# Patient Record
Sex: Female | Born: 1970 | Race: White | Hispanic: No | Marital: Married | Smoking: Never smoker
Health system: Southern US, Community
[De-identification: ages and names within clinical notes are randomized; demographics above are authoritative.]

## PROBLEM LIST (undated history)

## (undated) DIAGNOSIS — L511 Stevens-Johnson syndrome: Secondary | ICD-10-CM

## (undated) DIAGNOSIS — Z9889 Other specified postprocedural states: Secondary | ICD-10-CM

## (undated) DIAGNOSIS — F419 Anxiety disorder, unspecified: Secondary | ICD-10-CM

## (undated) DIAGNOSIS — R635 Abnormal weight gain: Secondary | ICD-10-CM

## (undated) DIAGNOSIS — E669 Obesity, unspecified: Secondary | ICD-10-CM

## (undated) DIAGNOSIS — E039 Hypothyroidism, unspecified: Secondary | ICD-10-CM

## (undated) DIAGNOSIS — Z975 Presence of (intrauterine) contraceptive device: Secondary | ICD-10-CM

## (undated) DIAGNOSIS — R319 Hematuria, unspecified: Secondary | ICD-10-CM

## (undated) HISTORY — DX: Abnormal weight gain: R63.5

## (undated) HISTORY — DX: Obesity, unspecified: E66.9

## (undated) HISTORY — DX: Presence of (intrauterine) contraceptive device: Z97.5

## (undated) HISTORY — PX: LAPAROSCOPIC GASTRIC SLEEVE RESECTION: SHX5895

## (undated) HISTORY — DX: Stevens-Johnson syndrome: L51.1

## (undated) HISTORY — PX: WRIST SURGERY: SHX841

## (undated) HISTORY — DX: Hematuria, unspecified: R31.9

## (undated) HISTORY — DX: Anxiety disorder, unspecified: F41.9

## (undated) HISTORY — PX: CHOLECYSTECTOMY: SHX55

## (undated) HISTORY — PX: KNEE SURGERY: SHX244

---

## 1998-01-15 ENCOUNTER — Emergency Department (HOSPITAL_COMMUNITY): Admission: EM | Admit: 1998-01-15 | Discharge: 1998-01-15 | Payer: Self-pay | Admitting: *Deleted

## 2002-02-07 ENCOUNTER — Ambulatory Visit (HOSPITAL_COMMUNITY): Admission: AD | Admit: 2002-02-07 | Discharge: 2002-02-07 | Payer: Self-pay | Admitting: Obstetrics and Gynecology

## 2002-03-08 ENCOUNTER — Ambulatory Visit (HOSPITAL_COMMUNITY): Admission: AD | Admit: 2002-03-08 | Discharge: 2002-03-08 | Payer: Self-pay | Admitting: Obstetrics and Gynecology

## 2002-03-10 ENCOUNTER — Ambulatory Visit (HOSPITAL_COMMUNITY): Admission: RE | Admit: 2002-03-10 | Discharge: 2002-03-10 | Payer: Self-pay | Admitting: Obstetrics and Gynecology

## 2002-03-12 ENCOUNTER — Ambulatory Visit (HOSPITAL_COMMUNITY): Admission: AD | Admit: 2002-03-12 | Discharge: 2002-03-12 | Payer: Self-pay | Admitting: Obstetrics & Gynecology

## 2002-03-27 ENCOUNTER — Inpatient Hospital Stay (HOSPITAL_COMMUNITY): Admission: AD | Admit: 2002-03-27 | Discharge: 2002-03-29 | Payer: Self-pay | Admitting: Obstetrics and Gynecology

## 2002-10-15 ENCOUNTER — Emergency Department (HOSPITAL_COMMUNITY): Admission: EM | Admit: 2002-10-15 | Discharge: 2002-10-15 | Payer: Self-pay | Admitting: *Deleted

## 2002-10-15 ENCOUNTER — Encounter: Payer: Self-pay | Admitting: *Deleted

## 2002-10-15 ENCOUNTER — Inpatient Hospital Stay (HOSPITAL_COMMUNITY): Admission: AD | Admit: 2002-10-15 | Discharge: 2002-10-17 | Payer: Self-pay | Admitting: General Surgery

## 2004-02-05 ENCOUNTER — Ambulatory Visit (HOSPITAL_COMMUNITY): Admission: RE | Admit: 2004-02-05 | Discharge: 2004-02-05 | Payer: Self-pay | Admitting: Family Medicine

## 2007-09-20 ENCOUNTER — Other Ambulatory Visit: Admission: RE | Admit: 2007-09-20 | Discharge: 2007-09-20 | Payer: Self-pay | Admitting: Obstetrics and Gynecology

## 2008-09-04 ENCOUNTER — Other Ambulatory Visit: Admission: RE | Admit: 2008-09-04 | Discharge: 2008-09-04 | Payer: Self-pay | Admitting: Obstetrics and Gynecology

## 2010-10-01 ENCOUNTER — Other Ambulatory Visit (HOSPITAL_COMMUNITY)
Admission: RE | Admit: 2010-10-01 | Discharge: 2010-10-01 | Disposition: A | Discharge status: Home / Self Care | Payer: 59 | Source: Ambulatory Visit | Attending: Obstetrics and Gynecology | Admitting: Obstetrics and Gynecology

## 2010-10-01 DIAGNOSIS — Z01419 Encounter for gynecological examination (general) (routine) without abnormal findings: Secondary | ICD-10-CM | POA: Insufficient documentation

## 2010-10-23 NOTE — Op Note (Signed)
   NAMEPATTI, SHORB                         ACCOUNT NO.:  [id number]   MEDICAL RECORD NO.:  [id number]                   PATIENT TYPE:  INP   LOCATION:  A428                                 FACILITY:  [facility]   PHYSICIAN:  Tilda Burrow, M.D.              DATE OF BIRTH:  30-Jan-1971   DATE OF PROCEDURE:  03/28/2002  DATE OF DISCHARGE:                                 OPERATIVE REPORT   TIME OF DELIVERY:  11:15 a.m.   PROCEDURE:  Spontaneous vertex vaginal delivery.   PROCEDURE IN DETAIL:  The patient progressed to anterior lip dilation and  pushed briefly for a second stage of less than 15 minutes, delivering over  an intact perineum with a second-degree perineal laceration delivering a  healthy 7 pound 13.6 ounce female infant, Apgars 9/9.  Delivery was attended  by the patient's husband.  The infant was suctioned.  Amniotic fluid was  clear without malodor and the infant was placed on the maternal abdomen.  The cord was clamped and cord blood samples obtained.  There was obtaining  of a sample for fetal blood cryopreservation.  This consisted of prepping a  portion of the cord near the introitus, placing a 14-gauge needle into the  umbilical vein, and collecting approximately 100 cc of cord blood.  We then  clamped the cord and then proceeded with obtaining the fetal blood gas from  the cord from the arterial sample of the blood in the cord and also a venous  sample for routine labs here at the hospital.  The patient tolerated the  procedure well with shelf presentation delivery of the placenta then repair  of the second-degree perineal laceration using continuous 2-0 Vicryl under  excellent condition under epidural anesthesia.  The patient then had the  epidural catheter removed with tip visualized and intact.  Estimated blood  loss of delivery 500 cc.                                               Tilda Burrow, M.D.    JVF/MEDQ  D:  03/28/2002  T:  03/28/2002  Job:   [id number]

## 2010-10-23 NOTE — Discharge Summary (Signed)
NAMEBETHANY, [name]                         ACCOUNT NO.:  [id number]   MEDICAL RECORD NO.:  [id number]                   PATIENT TYPE:  INP   LOCATION:  A303                                 FACILITY:  [facility]   PHYSICIAN:  [name], M.D.              DATE OF BIRTH:  05-22-1971   DATE OF ADMISSION:  10/15/2002  DATE OF DISCHARGE:  10/17/2002                                 DISCHARGE SUMMARY   DIAGNOSES:  Cholecystitis and cholelithiasis.   SECONDARY DIAGNOSIS:  Hypothyroidism.   PROCEDURE:  On 10/16/2002, laparoscopic cholecystectomy.   HISTORY:  This is a 40 year old, obese white female who has had several  episodes of right upper quadrant pain, nausea, and vomiting.  She was seen  in the emergency room, and she was noted to have on sonography  cholelithiasis.  She had some minimal thickening of her gallbladder with  normal common bile duct size and liver function studies which were within  normal limits.  The patient, however, had considerable right upper quadrant  pain and required hospitalization in my opinion.  She was admitted on  10/15/2002.   HOSPITAL COURSE:  She was taken to surgery on 10/16/2002 after hydration and  antibiotics were initiated.  She underwent laparoscopic cholecystectomy  uneventfully on 10/16/2002.  Intraoperatively, she was noted to have acute  and chronic calculus cholecystitis.  There was some resolving edema in the  right upper quadrant, otherwise was within normal limits.  She did have  cholelithiasis.  The procedure was able to be performed laparoscopically  without problems.   Postoperatively, the patient did quite well.  She had no problems from her  surgery so that she was discharged on the following postoperative day.  At  the time of discharge, she was tolerating liquids well.  Her wounds were  clean.  She was having no dysuria, no nausea, no vomiting, and her pain was  controlled.   LABORATORY DATA:  Electrolytes were all within  normal limits.  TSH was  20.937.  Free T4 was 0.84.  T3 uptake was 33.1.  On 10/17/2002, hemoglobin  was 11.3, hematocrit 33.1.  As stated, her liver function studies and  amylase preoperatively were within normal limits.   Sonogram showed minimal thickening with normal hepatic radicles.  I do not  see her sonogram on the chart at the time of this dictation.   Her cervix imaging was negative.   DISPOSITION:  Her hospital course was uneventful.  She was discharged on the  first postoperative day, and we made plans for followup with her.  She was  discharged on full liquid and soft diet.  She was told to advance her diet  as tolerated.  Increase her activity likewise as tolerated.  She is  permitted to shower  She is told to do no heavy lifting or straining or  sexual activity in the immediate postoperative period.  She was told to  clean her wounds with alcohol three times a day, use no aspirin products.   DISCHARGE MEDICATIONS:  She was given prescription for Darvocet-N 100 one  tablet every 4 hours as needed for pain, and she is told to resume her  thyroid medications and follow up on her hypothyroid status with her medical  service.  I will make followup arrangements after which she is to return to  Dr. Lilyan Punt for any medical problems she may have in the future.                                               [name], M.D.    WB/MEDQ  D:  10/24/2002  T:  10/24/2002  Job:  [id number]   cc:   Lorin Picket A. Gerda Diss, M.D.  [address], [address]  [place]  [place] [address]  Fax: [phone]   Tilda Burrow, M.D.  [address] [place]  [place] [address]  Fax: [phone]

## 2010-10-23 NOTE — Op Note (Signed)
   NAMELEDONNA, DORMER                         ACCOUNT NO.:  [id number]   MEDICAL RECORD NO.:  [id number]                   PATIENT TYPE:  INP   LOCATION:  A428                                 FACILITY:  [facility]   PHYSICIAN:  Tilda Burrow, M.D.              DATE OF BIRTH:  10/29/1970   DATE OF PROCEDURE:  03/28/2002  DATE OF DISCHARGE:                                 OPERATIVE REPORT   PROCEDURE:  Epidural catheter placement.   DESCRIPTION OF PROCEDURE:  Epidural catheter placed using loss of resistance  technique at L3-4 or L2-3 interspace, with excellent success.  We found the  epidural space on the first attempt at approximately 7 cm depth from the  skin with 5 cc test dose of 1.5% Xylocaine with epinephrine instilled,  followed by insertion of the epidural catheter 2.5 cm into the epidural  space, with removal of Tuohy needle and taping of the catheter to the  patient's back without difficulty.  The patient tolerated the procedure well  and is having excellent analgesic level at T10.                                               Tilda Burrow, M.D.    JVF/MEDQ  D:  03/28/2002  T:  03/28/2002  Job:  [id number]

## 2010-10-23 NOTE — H&P (Signed)
[name], [name] NO.:  [id number]   MEDICAL RECORD NO.:  [id number]                   PATIENT TYPE:   LOCATION:                                       FACILITY:   PHYSICIAN:  Tilda Burrow, M.D.              DATE OF BIRTH:   DATE OF ADMISSION:  03/27/2002  DATE OF DISCHARGE:                                HISTORY & PHYSICAL   ADMITTING DIAGNOSIS:  1. Pregnancy [redacted] weeks gestation.  2. Elective induction of labor.   HISTORY OF PRESENT ILLNESS:  This is a 40 year old female, gravida 2, para  1, AB 0, LMP 06/28/01 placing initial EDC at 04/04/2002 with corresponding  first and second trimester ultrasounds.  She is admitted after a pregnancy  course followed through out office with appropriate weight gain with the  estimated fetal weight of 8.3 pounds on ultrasound 03/26/2002 with upper  limits normal amniotic fluid index of 21.6.  She has had progressive  headaches, nausea, vomiting, and nose bleeds over the past week. She has had  a blood pressure check at home of about 148/100.  The family members have  checked her blood pressures regularly.  She has 2+ reflexes without clonus.  She is admitted after discussion of options and requests for induction as  she is exhausted and strongly desires delivery.   Of note, is that the patient's previous pregnancy ended in induction at 39  weeks for mild polyhydramnios and she was admitted for Cytotec ripening, had  a wonderful response to Cytotec twice on microgram tablets placed per vagina  x2 with a 6 hour labor.  Cervix is essentially the same this time as with  the previous pregnancy being 1 cm long posterior vertex presentation well  applied, soft.  The patient is aware of the usual risks of cesarean delivery  or the need for emergency intervention occurring with induced deliveries.   PAST MEDICAL HISTORY:  Positive for hypothyroidism treated this pregnancy  with Synthroid with TSH within normal  limits when last checked at [redacted] weeks  gestation.   LABORATORY DATA:  Prenatal labs include blood type A positive, urine index  3, negative rubella immunity present.  Hemoglobin 12, hematocrit 40.2.  Hepatitis, HIV, GC,  Chlamydia, and RPR all normal.  MSAFP normal.  Pap  smear class 1.  Glucose tolerance test 121 mg%.   MEDICATIONS:  Synthroid 0.15 mg every day, prenatal vitamins, Zoloft 50 mg  every day, Prenate GT.   SOCIAL HISTORY:  Married, Glass blower/designer.    PHYSICAL EXAMINATION:  VITAL SIGNS:  Height 5 feet 4 inches, weight 270  which is an 8 pound weight gain.  ABDOMEN:  Fundal height 40-42 cm.  Estimated fetal weight 8.3 pounds.   PLAN:  Cytotec cervical ripening on the evening of 03/27/02.  Pitocin  induction on 03/28/02.  Tilda Burrow, M.D.    JVF/MEDQ  D:  03/26/2002  T:  03/27/2002  Job:  [id number]

## 2010-10-23 NOTE — Op Note (Signed)
[name], [name]                         ACCOUNT NO.:  [id number]   MEDICAL RECORD NO.:  [id number]                   PATIENT TYPE:  INP   LOCATION:  A303                                 FACILITY:  [facility]   PHYSICIAN:  [name], M.D.              DATE OF BIRTH:  04/21/1971   DATE OF PROCEDURE:  10/16/2002  DATE OF DISCHARGE:  10/17/2002                                 OPERATIVE REPORT   PREOPERATIVE DIAGNOSES:  Cholecystitis and cholelithiasis.   POSTOPERATIVE DIAGNOSES:  Cholecystitis and cholelithiasis.   PROCEDURE:  Laparoscopic cholecystectomy.   SPECIMENS:  Gallbladder with stones.   HISTORY OF PRESENT ILLNESS:  This is a 40 year old obese white female who  had several episodes of right upper quadrant pain, nausea and vomiting. She  was seen through the emergency room at [facility]. She was found to  have on sonogram, cholelithiasis with minimal thickening and no dilatation  of the common bile duct or intrahepatic biliary radicals. Her liver function  studies, bilirubin, and amylase all were within normal limits  preoperatively. We had planned to take her to surgery. We discussed the  surgery preoperatively with her, discussing complications, not limited to  but including bleeding, infection, damage to bile duct, perforation of  organs and transitory diarrhea. Informed consent was obtained.   OPERATIVE FINDINGS:  The patient had a small cystic duct which was not  cannulated. Multiple stones within the gallbladder and some adhesions and  resolving edema of the gallbladder. Otherwise, the right upper quadrant  appeared to be normal.   TECHNIQUE:  The patient was placed in the supine position with the adequate  administration of general anesthesia via endotracheal intubation. Her entire  abdomen was prepped with Betadine solution and draped in the usual manner.  Prior to this, a Foley catheter was aseptically inserted and then with the  patient in  Trendelenburg, a periumbilical incision was carried out on the  superior aspect of the umbilicus and with the patient in Trendelenburg, the  fascia was grasped with a sharp towel clip and a Veress needle was inserted  and confirmed in position with a saline drop test. The Visiport technique  was then used and an 11 mm [place] surgical cannula was placed in the umbilicus  and then under direct vision, three other cannulas were placed with an 11 mm  cannula in the epigastrium and two 5 mm cannulas in the right upper quadrant  laterally. The gallbladder was then grasped and it's adhesions were taken  down. The cystic duct was clearly visualized, triply silver clipped on the  side of the common bile duct and singularly silver clipped on the side of  the gallbladder. The cystic artery was likewise triply silver clipped and  divided and the gallbladder was then removed from the liver bed using the  hook cautery device. There was a minimal amount of oozing. This was easily  controlled  with a cautery device. I elected to leave a piece of Surgicel  within the liver bed. After irrigating and checking for hemostasis and  removing the gallbladder from the 11 mm cannulas in the epigastrium using  the Endo-sac device, the abdomen was then checked for hemostasis and was  deemed to be complete. The abdomen was then desufflated. The fascial  incisions around the 11 mm cannula site of  the epigastric and the umbilicus were closed with 0 poly-sorb suture and all  skin wounds were closed with the stapling device. Sterile op-site dressing  was applied. No drains were placed. Estimated blood loss was minimal. The  patient received 1500 to 1600 cc of Crystalloid intraoperatively. There were  no complications.                                               [name], M.D.    WB/MEDQ  D:  10/16/2002  T:  10/17/2002  Job:  [id number]   cc:   Tilda Burrow, M.D.  [address] [place]  [place]  [address]  Fax: [phone]   Scott A. Gerda Diss, M.D.  [address]  [place]  [place] [address]  Fax: [phone]

## 2010-10-23 NOTE — H&P (Signed)
NAME:  FERGIE, SHERBERT                         ACCOUNT NO.:  [id number]   MEDICAL RECORD NO.:  [id number]                  PATIENT TYPE:  PINP   LOCATION:  303                                  FACILITY:   PHYSICIAN:  [name], M.D.              DATE OF BIRTH:   DATE OF ADMISSION:  10/15/2002  DATE OF DISCHARGE:                                HISTORY & PHYSICAL   NOTE:  This is a 40 year old obese white female who had recurrent episodes  of right upper quadrant pain, nausea, and vomiting.  She was seen in the  emergency room last night.  She was referred to another general surgeon;  however, she chose to come to my office because of longstanding family  treatment in the past.   CHIEF COMPLAINT:  Right upper quadrant pain, nausea, and vomiting.   HISTORY OF PRESENT MEDICAL ILLNESS:  The patient states that she had had  these symptoms when she was approximately eight months pregnant, and after  giving birth, which she is approximately six months postpartum, she did not  have any recurrence of the pain in that severity until last evening.  She  states that she was seen in the emergency room; liver function studies and  amylase were within normal limits.  The patient had considerable pain that  required narcotics for control.  Her sonogram revealed multiple stones  within the gallbladder but no pericholecystic fluid and minimal thickening.  I admitted her directly from my office due to the fact that she stated she  had considerable pain.  We planned for initiating antibiotics and making her  n.p.o. and plan for surgery within 24 hours.   PAST MEDICAL HISTORY:  The patient has a history of hypothyroidism, for  which she takes Synthroid.   PAST SURGICAL HISTORY:  Includes right knee surgery 15 years ago, and the  left wrist was operated on twice, the last time in July of 2002; this was  for DeQuervain syndrome.   MEDICATIONS:  Synthroid 0.15 mg p.o. daily.   ALLERGIES:   She is allergic to ACTIFED, which produces hives.   PHYSICAL EXAMINATION:  GENERAL/VITAL SIGNS:  A pleasant, 5-foot-4-inch, 272-  pound patient whose temperature is 97.2, pulse rate 80 per minute,  respirations 20 per minute, blood pressure 120/86.  HEAD:  Normocephalic.  EYES:  Extraocular movements are intact.  Pupils are round and react to  light and accommodation.  There is no conjunctival pallor or scleral  injection.  The sclerae are of normal tincture.  NECK:  There is no adenopathy appreciated.  CHEST:  Clear to both anterior and posterior auscultation.  HEART:  Regular rhythm.  BREASTS AND AXILLAE:  Without masses.  ABDOMEN:  The patient is softly tender in the right upper quadrant.  No  rebound is appreciated.  No hernias are appreciated.  RECTAL EXAMINATION:  The stool is guaiac negative.  PELVIC EXAMINATION:  Deferred.  EXTREMITIES:  She is status post right knee surgery and left wrist surgery.   REVIEW OF SYSTEMS:  NEURO SYSTEM:  No history of migraines or seizures.  ENDOCRINE SYSTEM:  Positive for hypothyroidism.  No past history of diabetes  mellitus.  CARDIOPULMONARY SYSTEM:  Patient is a nonsmoker, nondrinker.  No  history of chest pain or any heart problems.  MUSCULOSKELETAL SYSTEM:  Status post right knee surgery and left wrist surgery, and patient has  obesity.  OB/GYN HISTORY:  She is a gravida 2, para 2, cesarean 0, abortus 0  patient.  No family history of breast cancer.  Her menstrual periods are  irregular.  She has an IUD placed.  GI SYSTEM:  No history of hepatitis,  constipation, change in bowels.  No past history of bright red rectal  bleeding or melena.  No history of inflammatory bowel disease.  No history  of unexplained weight loss.  GU SYSTEM:  No history of dysuria, frequency,  or nephrolithiasis.   IMPRESSION:  1. This is a 40 year old white female who has had episodes of right upper     quadrant pain, nausea, and vomiting secondary to  cholecystitis or     cholelithiasis.  We will plan for a laparoscopic cholecystectomy if     possible.  We have discussed this in detail with the patient, discussing     complications, not limited to but including bleeding, infection, damage     to bile ducts, perforation of organs, and transitory diarrhea.  Informed     consent was obtained.  2. Other medical problems include hypothyroidism, for which she is taking     Synthroid supplements.   PLAN:  We will check the rest of her laboratory work, which will include  thyroid function studies, and check a serum HCG prior to surgery.  We have  discussed this surgery in detail with the patient, and all questions were  answered, and we plan for surgery in the a.m.   NOTE:  I informed this patient that I will have to be out of town on  Wednesday; I will have Dr. Emelda Fear follow this patient, as he is known to  her as well, and she was also given a choice of having another surgeon;  however, she was adamant in having me perform the surgery prior to my  departure.                                               [name], M.D.    WB/MEDQ  D:  10/15/2002  T:  10/16/2002  Job:  [id number]   cc:   Lorin Picket A. Gerda Diss, M.D.  [address]  [place]  [place] [address]  Fax: [phone]

## 2012-07-03 ENCOUNTER — Encounter (HOSPITAL_COMMUNITY): Payer: Self-pay | Admitting: *Deleted

## 2012-07-03 DIAGNOSIS — Z79899 Other long term (current) drug therapy: Secondary | ICD-10-CM

## 2012-07-03 DIAGNOSIS — Z9884 Bariatric surgery status: Secondary | ICD-10-CM

## 2012-07-03 DIAGNOSIS — E039 Hypothyroidism, unspecified: Secondary | ICD-10-CM

## 2012-07-03 DIAGNOSIS — T7840XA Allergy, unspecified, initial encounter: Secondary | ICD-10-CM

## 2012-07-03 DIAGNOSIS — L270 Generalized skin eruption due to drugs and medicaments taken internally: Principal | ICD-10-CM | POA: Diagnosis present

## 2012-07-03 DIAGNOSIS — F329 Major depressive disorder, single episode, unspecified: Secondary | ICD-10-CM

## 2012-07-03 DIAGNOSIS — F3289 Other specified depressive episodes: Secondary | ICD-10-CM | POA: Diagnosis present

## 2012-07-03 DIAGNOSIS — F32A Depression, unspecified: Secondary | ICD-10-CM

## 2012-07-03 DIAGNOSIS — T360X5A Adverse effect of penicillins, initial encounter: Secondary | ICD-10-CM | POA: Diagnosis present

## 2012-07-03 DIAGNOSIS — L511 Stevens-Johnson syndrome: Secondary | ICD-10-CM

## 2012-07-03 HISTORY — DX: Hypothyroidism, unspecified: E03.9

## 2012-07-03 LAB — CBC WITH DIFFERENTIAL/PLATELET
Eosinophils Relative: 3 % (ref 0–5)
HCT: 36.5 % (ref 36.0–46.0)
Lymphocytes Relative: 39 % (ref 12–46)
Lymphs Abs: 4 10*3/uL (ref 0.7–4.0)
MCV: 89.2 fL (ref 78.0–100.0)
Neutro Abs: 5.3 10*3/uL (ref 1.7–7.7)
Platelets: 335 10*3/uL (ref 150–400)
RBC: 4.09 MIL/uL (ref 3.87–5.11)
WBC: 10.4 10*3/uL (ref 4.0–10.5)

## 2012-07-03 LAB — COMPREHENSIVE METABOLIC PANEL
ALT: 9 U/L (ref 0–35)
Alkaline Phosphatase: 72 U/L (ref 39–117)
CO2: 27 mEq/L (ref 19–32)
Calcium: 8.8 mg/dL (ref 8.4–10.5)
Chloride: 99 mEq/L (ref 96–112)
GFR calc Af Amer: >90 mL/min (ref >90)
GFR calc non Af Amer: >90 mL/min (ref >90)
Glucose, Bld: 101 mg/dL — ABNORMAL HIGH (ref 70–99)
Sodium: 135 mEq/L (ref 135–145)
Total Bilirubin: 0.1 mg/dL — ABNORMAL LOW (ref 0.3–1.2)

## 2012-07-03 LAB — URINALYSIS, ROUTINE W REFLEX MICROSCOPIC
Glucose, UA: NEGATIVE mg/dL
Hgb urine dipstick: NEGATIVE
Specific Gravity, Urine: 1.034 — ABNORMAL HIGH (ref 1.005–1.030)

## 2012-07-03 LAB — PREGNANCY, URINE: Preg Test, Ur: NEGATIVE

## 2012-07-03 MED ORDER — ONDANSETRON HCL 4 MG/2ML IJ SOLN
4.0000 mg | Freq: Once | INTRAMUSCULAR | Status: AC
  Administered 2012-07-04: 4 mg via INTRAVENOUS
  Filled 2012-07-03: qty 2

## 2012-07-03 MED ORDER — METHYLPREDNISOLONE SODIUM SUCC 125 MG IJ SOLR
125.0000 mg | Freq: Once | INTRAMUSCULAR | Status: AC
  Administered 2012-07-04: 125 mg via INTRAVENOUS
  Filled 2012-07-03: qty 2

## 2012-07-03 MED ORDER — SODIUM CHLORIDE 0.9 % IV BOLUS (SEPSIS)
1000.0000 mL | Freq: Once | INTRAVENOUS | Status: AC
  Administered 2012-07-04: 1000 mL via INTRAVENOUS

## 2012-07-03 MED ORDER — MORPHINE SULFATE 4 MG/ML IJ SOLN
4.0000 mg | Freq: Once | INTRAMUSCULAR | Status: AC
  Administered 2012-07-04: 4 mg via INTRAVENOUS
  Filled 2012-07-03: qty 1

## 2012-07-03 MED ORDER — SODIUM CHLORIDE 0.9 % IV BOLUS (SEPSIS)
1000.0000 mL | Freq: Once | INTRAVENOUS | Status: AC
  Administered 2012-07-03: 1000 mL via INTRAVENOUS

## 2012-07-03 NOTE — ED Notes (Signed)
Pt reports being dx with [name] today at her PCP after taking amoxicillin.  Pts bilateral feet are peeling, skin on hand sloughing and pts lips are affected.

## 2012-07-03 NOTE — ED Notes (Signed)
Patient denied need for morphine at this time.  Encouraged patient to call if she needs pain medications

## 2012-07-03 NOTE — ED Notes (Signed)
Patient resting with family at bedside.

## 2012-07-03 NOTE — ED Provider Notes (Signed)
History  This chart was scribed for Barbara Octave, MD by Bennett Scrape, ED Scribe. This patient was seen in room A12C/A12C and the patient's care was started at 9:39 PM.  CSN: [id number]  Arrival date & time 07/03/12  2127   First MD Initiated Contact with Patient 07/03/12 2139      Chief Complaint  Patient presents with  . Allergic Reaction     The history is provided by the patient. No language interpreter was used.    [name] is a 42 y.o. 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 She states that her feet started peeling last night but have become more painful tonight. She reports taking amoxicillin several years ago in [place] but states that she attributed the symptoms to overuse of hand sanitizer. She states that she had another episode in November 2013 but states that her husband was hospitalized for Strep B infection at [facility], so she overlooked her symptoms. She reports that she was put on amoxicillin recently for an URI, last dose was 5 days ago when her symptoms began again. She states that she had tongue swelling but denies swelling currently. She denies involvement of the genitals or mouth. She denies trouble swallowing, abdominal pain, fevers, and emesis as associated symptoms. She has a h/o hypothyroidism and denies smoking and alcohol use.  PCP is Dr. Lilyan Punt  Past Medical History  Diagnosis Date  . Hypothyroidism     Past Surgical History  Procedure Date  . Knee surgery   . Wrist surgery   . Cholecystectomy   . Laparoscopic gastric sleeve resection     History reviewed. No pertinent family history.  History  Substance Use Topics  . Smoking status: Never Smoker   . Smokeless tobacco: Not on file  . Alcohol  Use: No    No OB history provided.  Review of Systems  A complete 10 system review of systems was obtained and all systems are negative except as noted in the HPI and PMH.   Allergies  Amoxicillin; Actifed cold-allergy; and Penicillins  Home Medications   Current Outpatient Rx  Name  Route  Sig  Dispense  Refill  . ESCITALOPRAM OXALATE 20 MG PO TABS   Oral   Take 20 mg by mouth daily.         Marland Kitchen LEVOTHYROXINE SODIUM 125 MCG PO TABS   Oral   Take 125 mcg by mouth daily.           Triage Vitals: BP 148/77  Pulse 71  Temp 98.7 F (37.1 C) (Oral)  Resp 16  SpO2 97%  Physical Exam  Nursing note and vitals reviewed. Constitutional: She is oriented to person, place, and time. She appears well-developed and well-nourished. No distress.  HENT:  Head: Normocephalic and atraumatic.  Mouth/Throat: Oropharynx is clear and moist.       Erythematous tongue without ulcerations, no ulcerations to the lips  Eyes: Conjunctivae normal and EOM are normal. Pupils are equal, round, and reactive to light.  Neck: Neck supple. No tracheal deviation present.  Cardiovascular: Normal rate and regular rhythm.   Pulmonary/Chest: Effort normal and breath sounds normal. No respiratory distress.  Abdominal: Soft. There is no tenderness.  Musculoskeletal: Normal range of motion.  Neurological: She is alert and oriented  to person, place, and time.       5/5 strength throughout   Skin: Skin is warm and dry.       sluffing skin of to tips of fingers and soles of feet  Psychiatric: She has a normal mood and affect. Her behavior is normal.    ED Course  Procedures (including critical care time)  DIAGNOSTIC STUDIES: Oxygen Saturation is 97% on room air, adequate by my interpretation.    COORDINATION OF CARE: 9:45 PM-Discussed treatment plan which includes CXR, CBC panel, UA with pt at bedside and pt agreed to plan.   11:00 PM-Consult complete with Dr. March Rummage. Patient case explained and  discussed. He agrees to admit patient for further evaluation and treatment. Call ended at 11:01 PM.   11:02 PM-Pt rechecked and feels the same. Discussed consult with [facility] and plan to admit for overnight IV fluids and antibiotics and pt agreed.  Labs Reviewed  COMPREHENSIVE METABOLIC PANEL - Abnormal; Notable for the following:    Potassium 3.4 (*)     Glucose, Bld 101 (*)     Albumin 3.3 (*)     Total Bilirubin 0.1 (*)     All other components within normal limits  URINALYSIS, ROUTINE W REFLEX MICROSCOPIC - Abnormal; Notable for the following:    APPearance CLOUDY (*)     Specific Gravity, Urine 1.034 (*)     Bilirubin Urine SMALL (*)     All other components within normal limits  CBC WITH DIFFERENTIAL  PREGNANCY, URINE   No results found.   1. Stevens-Johnson syndrome   2. Allergic reaction   3. Hypothyroidism       MDM  Desquamating rash to palms, soles and tongue. Concern for [employer] syndrome after taking amoxicillin. No difficulty breathing or swallowing.  No wheezing.  IVF, labs. Admit for monitor to ensure no progression of symptoms.  I personally performed the services described in this documentation, which was scribed in my presence. The recorded information has been reviewed and is accurate.      Barbara Octave, MD 07/03/12 [phone]

## 2012-07-04 DIAGNOSIS — F329 Major depressive disorder, single episode, unspecified: Secondary | ICD-10-CM | POA: Diagnosis present

## 2012-07-04 DIAGNOSIS — F32A Depression, unspecified: Secondary | ICD-10-CM | POA: Diagnosis present

## 2012-07-04 MED ORDER — ONDANSETRON HCL 4 MG PO TABS: 4.0000 mg | ORAL_TABLET | Freq: Four times a day (QID) | ORAL | Status: DC | PRN

## 2012-07-04 MED ORDER — ZOLPIDEM TARTRATE 5 MG PO TABS
5.0000 mg | ORAL_TABLET | Freq: Every evening | ORAL | Status: DC | PRN
  Administered 2012-07-04 – 2012-07-06 (×2): 5 mg via ORAL
  Filled 2012-07-04 (×2): qty 1

## 2012-07-04 MED ORDER — ENOXAPARIN SODIUM 40 MG/0.4ML ~~LOC~~ SOLN
40.0000 mg | Freq: Every day | SUBCUTANEOUS | Status: DC
  Administered 2012-07-04 – 2012-07-06 (×3): 40 mg via SUBCUTANEOUS
  Filled 2012-07-04 (×3): qty 0.4

## 2012-07-04 MED ORDER — LEVOTHYROXINE SODIUM 125 MCG PO TABS
125.0000 ug | ORAL_TABLET | Freq: Every day | ORAL | Status: DC
  Administered 2012-07-04 – 2012-07-06 (×3): 125 ug via ORAL
  Filled 2012-07-04 (×4): qty 1

## 2012-07-04 MED ORDER — ACETAMINOPHEN 325 MG PO TABS: 650.0000 mg | ORAL_TABLET | Freq: Four times a day (QID) | ORAL | Status: DC | PRN

## 2012-07-04 MED ORDER — DIPHENHYDRAMINE HCL 25 MG PO CAPS: 25.0000 mg | ORAL_CAPSULE | Freq: Three times a day (TID) | ORAL | Status: DC | PRN

## 2012-07-04 MED ORDER — MORPHINE SULFATE 2 MG/ML IJ SOLN
1.0000 mg | INTRAMUSCULAR | Status: DC | PRN
  Administered 2012-07-04 – 2012-07-05 (×4): 1 mg via INTRAVENOUS
  Filled 2012-07-04 (×4): qty 1

## 2012-07-04 MED ORDER — SODIUM CHLORIDE 0.9 % IV SOLN: 250.0000 mL | INTRAVENOUS | Status: DC | PRN

## 2012-07-04 MED ORDER — ESCITALOPRAM OXALATE 20 MG PO TABS
20.0000 mg | ORAL_TABLET | Freq: Every day | ORAL | Status: DC
  Administered 2012-07-04 – 2012-07-06 (×3): 20 mg via ORAL
  Filled 2012-07-04 (×3): qty 1

## 2012-07-04 MED ORDER — FAMOTIDINE 20 MG PO TABS
20.0000 mg | ORAL_TABLET | Freq: Two times a day (BID) | ORAL | Status: DC
  Administered 2012-07-04 (×2): 20 mg via ORAL
  Filled 2012-07-04 (×3): qty 1

## 2012-07-04 MED ORDER — MORPHINE SULFATE 2 MG/ML IJ SOLN: 1.0000 mg | INTRAMUSCULAR | Status: DC | PRN

## 2012-07-04 MED ORDER — GI COCKTAIL ~~LOC~~
30.0000 mL | Freq: Once | ORAL | Status: AC
  Administered 2012-07-04: 30 mL via ORAL
  Filled 2012-07-04: qty 30

## 2012-07-04 MED ORDER — SODIUM CHLORIDE 0.9 % IJ SOLN: 3.0000 mL | INTRAMUSCULAR | Status: DC | PRN

## 2012-07-04 MED ORDER — ACETAMINOPHEN 650 MG RE SUPP: 650.0000 mg | Freq: Four times a day (QID) | RECTAL | Status: DC | PRN

## 2012-07-04 MED ORDER — METHYLPREDNISOLONE SODIUM SUCC 125 MG IJ SOLR
60.0000 mg | Freq: Three times a day (TID) | INTRAMUSCULAR | Status: DC
  Administered 2012-07-04 – 2012-07-05 (×3): 60 mg via INTRAVENOUS
  Filled 2012-07-04 (×6): qty 0.96

## 2012-07-04 MED ORDER — METHYLPREDNISOLONE SODIUM SUCC 125 MG IJ SOLR
60.0000 mg | Freq: Two times a day (BID) | INTRAMUSCULAR | Status: DC
  Administered 2012-07-04: 60 mg via INTRAVENOUS
  Filled 2012-07-04 (×2): qty 0.96

## 2012-07-04 MED ORDER — SODIUM CHLORIDE 0.9 % IJ SOLN
3.0000 mL | Freq: Two times a day (BID) | INTRAMUSCULAR | Status: DC
  Administered 2012-07-04 – 2012-07-06 (×6): 3 mL via INTRAVENOUS

## 2012-07-04 MED ORDER — WHITE PETROLATUM GEL
Status: AC
  Administered 2012-07-04: 11:00:00
  Filled 2012-07-04: qty 5

## 2012-07-04 MED ORDER — HYDROCODONE-ACETAMINOPHEN 5-325 MG PO TABS
1.0000 | ORAL_TABLET | ORAL | Status: DC | PRN
  Administered 2012-07-04 (×5): 1 via ORAL
  Administered 2012-07-05: 2 via ORAL
  Filled 2012-07-04: qty 1
  Filled 2012-07-04: qty 2
  Filled 2012-07-04 (×3): qty 1
  Filled 2012-07-04: qty 2

## 2012-07-04 MED ORDER — ONDANSETRON HCL 4 MG/2ML IJ SOLN: 4.0000 mg | Freq: Four times a day (QID) | INTRAMUSCULAR | Status: DC | PRN

## 2012-07-04 MED ORDER — RANITIDINE HCL 150 MG/10ML PO SYRP
150.0000 mg | ORAL_SOLUTION | Freq: Two times a day (BID) | ORAL | Status: DC
  Administered 2012-07-04 – 2012-07-05 (×2): 150 mg via ORAL
  Filled 2012-07-04 (×3): qty 10

## 2012-07-04 NOTE — ED Notes (Signed)
Pt states she feels sharp pain in mid sternum area. Pt states it hurts worse when moving and breathing. Pt denies indigestion but states she had [place] food earlier. Pt alert and oriented. Stat EKG performed: Sinus rhythm. Pt alert and oriented. Denies SOB. Vital signs stable. MD notified

## 2012-07-04 NOTE — Progress Notes (Signed)
[facility] PROGRESS NOTE  [name] ZOX:[id number] DOB: 08/13/1970 DOA: 07/03/2012 PCP: Default, Provider, MD  Assessment/Plan: 1. Allergic reaction to amoxicillin ? Early [name]: on high dose IV steroids, with benadryl and zantac. As per the patient her symptoms has improved.  2. Hypothyroidism: resume synthroid 3. Depression: resume lexapro.  4. DVT prophylaxis on lovenox.   Code Status: full code Family Communication: family at bedside? mother Disposition Plan: possibly tomorrow.       HPI/Subjective: Cmfortable. Wants to know when she can go home. Also reports some throat pain.   Objective: Filed Vitals:   07/04/12 0144 07/04/12 0158 07/04/12 0653 07/04/12 1336  BP: 117/70  114/64 125/60  Pulse: 61  49 48  Temp: 98.1 F (36.7 C)  98 F (36.7 C) 98.1 F (36.7 C)  TempSrc: Oral  Oral   Resp: 18  16 16   Height: 5\' 4"  (1.626 m)     Weight: 97.523 kg (215 lb)     SpO2: 95% 95% 94% 96%    Intake/Output Summary (Last 24 hours) at 07/04/12 1457 Last data filed at 07/04/12 0657  Gross per 24 hour  Intake    360 ml  Output      0 ml  Net    360 ml   Filed Weights   07/04/12 0144  Weight: 97.523 kg (215 lb)    Exam:   General:  Alert afebrile comfortable lying in bed.   Mouth: no ulcerations orally. Slightly erythematous tongue,  Cardiovascular: s1s2 RRR no MRG  Respiratory: CTAB, no wheezing or rhonchi  Abdomen: soft NT ND BS+   Extremities: sloughing and peeling of the skin at the fingertips of both hands with periungual involvement.  and heels of the feet, has improved when compared to admission as per the patient.   Data Reviewed: Basic Metabolic Panel:  Lab 07/03/12 8119  NA 135  K 3.4*  CL 99  CO2 27  GLUCOSE 101*  BUN 13  CREATININE 0.69  CALCIUM 8.8  MG --  PHOS --   Liver Function Tests:  Lab 07/03/12 2203  AST 13  ALT 9  ALKPHOS 72  BILITOT 0.1*  PROT 6.5  ALBUMIN 3.3*   No results found for this  basename: LIPASE:5,AMYLASE:5 in the last 168 hours No results found for this basename: AMMONIA:5 in the last 168 hours CBC:  Lab 07/03/12 2203  WBC 10.4  NEUTROABS 5.3  HGB 12.4  HCT 36.5  MCV 89.2  PLT 335   Cardiac Enzymes: No results found for this basename: CKTOTAL:5,CKMB:5,CKMBINDEX:5,TROPONINI:5 in the last 168 hours BNP (last 3 results) No results found for this basename: PROBNP:3 in the last 8760 hours CBG: No results found for this basename: GLUCAP:5 in the last 168 hours  No results found for this or any previous visit (from the past 240 hour(s)).   Studies: No results found.  Scheduled Meds:   . enoxaparin (LOVENOX) injection  40 mg Subcutaneous Daily  . escitalopram  20 mg Oral Daily  . levothyroxine  125 mcg Oral QAC breakfast  . methylPREDNISolone (SOLU-MEDROL) injection  60 mg Intravenous Q8H  . ranitidine  150 mg Oral BID  . sodium chloride  3 mL Intravenous Q12H   Continuous Infusions:   Principal Problem:  *Allergic reaction Active Problems:  Hypothyroidism  Depression        [name]  [facility] Pager [phone]. If 8PM-8AM, please contact night-coverage at [email or web address], password [facility] 07/04/2012, 2:57 PM  LOS: 1 day

## 2012-07-04 NOTE — H&P (Signed)
[facility]                                                                                    Patient Demographics  [name], is a 42 y.o. female  CSN: [id number]  MRN: [id number]  DOB - 07-05-1970  Admit Date - 07/03/2012  Outpatient Primary MD for the patient is Default, Provider, MD   With History of -  Past Medical History  Diagnosis Date  . Hypothyroidism       Past Surgical History  Procedure Date  . Knee surgery   . Wrist surgery   . Cholecystectomy   . Laparoscopic gastric sleeve resection     in for   Chief Complaint  Patient presents with  . Allergic Reaction     HPI  [name]  is a 42 y.o. female, with past medical history significant for hypothyroidism who received amoxicillin around 2 weeks ago for treatment of an upper respiratory infection presenting today with sloughing of the skin of the palms and soles 4 days after she finished an amoxicillin course. Remembers similar events in the past after taken amoxicillin. No chest pains shortness of breath, no history of nausea vomiting, no fever or chills.    Review of Systems    In addition to the HPI above,  No Fever-chills, No Headache, No changes with Vision or hearing, No problems swallowing food or Liquids, No Chest pain, Cough or Shortness of Breath, No Abdominal pain, No Nausea or Vommitting, Bowel movements are regular, No Blood in stool or Urine, No dysuria, No new joints pains-aches,  No new weakness, tingling, numbness in any extremity, No recent weight gain or loss, No polyuria, polydypsia or polyphagia, No significant Mental Stressors.  A full 10 point Review of Systems was done, except as stated above, all other Review of Systems were negative.   Social History History  Substance Use Topics  . Smoking status: Never Smoker   . Smokeless tobacco: Not on file  . Alcohol Use: No     Family History Hypertension and her mother  Prior to Admission  medications   Medication Sig Start Date End Date Taking? Authorizing Provider  escitalopram (LEXAPRO) 20 MG tablet Take 20 mg by mouth daily.   Yes Historical Provider, MD  levothyroxine (SYNTHROID, LEVOTHROID) 125 MCG tablet Take 125 mcg by mouth daily.   Yes Historical Provider, MD    Allergies  Allergen Reactions  . Amoxicillin Other (See Comments)    [name] syndrome  . Actifed Cold-Allergy (Chlorpheniramine-Phenyleph Er)     hives  . Penicillins     Amoxicillin-Steven Johnson Syndrome    Physical Exam  Vitals  Blood pressure 148/77, pulse 71, temperature 98.7 F (37.1 C), temperature source Oral, resp. rate 16, SpO2 97.00%.   1. General white American female in no acute distress at the moment.  2. Normal affect and insight, Not Suicidal or Homicidal, Awake Alert, Oriented X 3.  3. No F.N deficits, ALL C.Nerves Intact, Strength 5/5 all 4 extremities, Sensation intact all 4 extremities, Plantars down going.  4. Ears and Eyes appear Normal, Conjunctivae clear, PERRLA. Moist Oral Mucosa.  5. Supple Neck,  No JVD, No cervical lymphadenopathy appriciated, No Carotid Bruits.  6. Symmetrical Chest wall movement, Good air movement bilaterally, CTAB.  7. RRR, No Gallops, Rubs or Murmurs, No Parasternal Heave.  8. Positive Bowel Sounds, Abdomen Soft, Non tender, No organomegaly appriciated,No rebound -guarding or rigidity.  9.  No Cyanosis, Normal Skin Turgor, only skin involvement is on the palms and soles  10. Good muscle tone,  joints appear normal , no effusions, Normal ROM.  11. No Palpable Lymph Nodes in Neck or Axillae  12. Extremities : Sloughing of the skin of the palms and soles with periungual involvement.  Data Review  CBC  Lab 07/03/12 2203  WBC 10.4  HGB 12.4  HCT 36.5  PLT 335  MCV 89.2  MCH 30.3  MCHC 34.0  RDW 12.3  LYMPHSABS 4.0  MONOABS 0.7  EOSABS 0.3  BASOSABS 0.0  BANDABS --    ------------------------------------------------------------------------------------------------------------------  Chemistries   Lab 07/03/12 2203  NA 135  K 3.4*  CL 99  CO2 27  GLUCOSE 101*  BUN 13  CREATININE 0.69  CALCIUM 8.8  MG --  AST 13  ALT 9  ALKPHOS 72  BILITOT 0.1*   ------------------------------------------------------------------------------------------------------------------   ---------------------------------------------------------------------------------------------------------------  Urinalysis    Component Value Date/Time   COLORURINE YELLOW 07/03/2012 2157   APPEARANCEUR CLOUDY* 07/03/2012 2157   LABSPEC 1.034* 07/03/2012 2157   PHURINE 6.5 07/03/2012 2157   GLUCOSEU NEGATIVE 07/03/2012 2157   HGBUR NEGATIVE 07/03/2012 2157   BILIRUBINUR SMALL* 07/03/2012 2157   KETONESUR NEGATIVE 07/03/2012 2157   PROTEINUR NEGATIVE 07/03/2012 2157   UROBILINOGEN 1.0 07/03/2012 2157   NITRITE NEGATIVE 07/03/2012 2157   LEUKOCYTESUR NEGATIVE 07/03/2012 2157    ----------------------------------------------------------------------------------------------------------------     Imaging results:   No results found.      Assessment & Plan  1. allergic reaction probably to amoxicillin, probably early [employer] 2. Hypothyroidism 3. Depression  Plan IV Solu-Medrol H2-blocker Benadryl when necessary  Observed for 24 hours then discharged on by mouth prednisone   DVT Prophylaxis Lovenox  AM Labs Ordered, also please review Full Orders  Family Communication: Admission, patients condition and plan of care including tests being ordered have been discussed with the patient and mother who indicate understanding and agree with the plan and Code Status.  Code Status full  Disposition Plan: Place in observation for 24 hours and discharge home when stable  Time spent in minutes : 37 minutes  Condition fair

## 2012-07-05 MED ORDER — DIPHENHYDRAMINE HCL 25 MG PO CAPS
25.0000 mg | ORAL_CAPSULE | Freq: Three times a day (TID) | ORAL | Status: DC
  Administered 2012-07-05 – 2012-07-06 (×3): 25 mg via ORAL
  Filled 2012-07-05 (×6): qty 1

## 2012-07-05 MED ORDER — PREDNISONE 20 MG PO TABS
40.0000 mg | ORAL_TABLET | Freq: Every day | ORAL | Status: DC
  Administered 2012-07-05 – 2012-07-06 (×2): 40 mg via ORAL
  Filled 2012-07-05 (×3): qty 2

## 2012-07-05 MED ORDER — HYDROCORTISONE 1 % EX CREA
TOPICAL_CREAM | Freq: Two times a day (BID) | CUTANEOUS | Status: DC
  Administered 2012-07-05 – 2012-07-06 (×3): via TOPICAL
  Filled 2012-07-05: qty 28

## 2012-07-05 MED ORDER — HYDROCODONE-ACETAMINOPHEN 5-325 MG PO TABS
1.0000 | ORAL_TABLET | ORAL | Status: DC | PRN
  Administered 2012-07-05 – 2012-07-06 (×4): 1 via ORAL
  Filled 2012-07-05 (×4): qty 1

## 2012-07-05 MED ORDER — POTASSIUM CHLORIDE CRYS ER 20 MEQ PO TBCR
40.0000 meq | EXTENDED_RELEASE_TABLET | Freq: Once | ORAL | Status: AC
  Administered 2012-07-05: 40 meq via ORAL
  Filled 2012-07-05 (×2): qty 2

## 2012-07-05 MED ORDER — FAMOTIDINE 20 MG PO TABS
20.0000 mg | ORAL_TABLET | Freq: Two times a day (BID) | ORAL | Status: DC
  Administered 2012-07-05 – 2012-07-06 (×3): 20 mg via ORAL
  Filled 2012-07-05 (×4): qty 1

## 2012-07-05 NOTE — Progress Notes (Addendum)
[facility] PROGRESS NOTE  [name] AVW:[id number] DOB: 1971/01/20 DOA: 07/03/2012 PCP: Default, Provider, MD  Assessment/Plan: Principal Problem:  *Allergic reaction Active Problems:  Hypothyroidism  Depression    1. Allergic reaction to Ampicillin: Patient presented with sloughing of the skin of the palms and soles, 4 days after completing Amoxicillin course. Early Stevens-Johnson's syndrome was deemed possible, but there appears to have been no mucosal involvement. Per patient, she has had previous similar episodes in the past, to the same medication. Amoxicillin/PCN has been placed on her list of medication allergies. She was managed with high dose IV steroids, with Benadryl and H2RA, with clinical improvement. Will switch to oral steroid taper today. Hydrocortisone cream for volar lesions.  2. Hypothyroidism: Continued on thyroxine replacement therapy.  3.  Depression: Stable on Lexapro.    Code Status: Full Code.  Family Communication:  Disposition Plan: To be determined.    Brief narrative: 42 y.o. female, with history significant of hypothyroidism and depression, who received Amoxicillin about 2 weeks ago, for treatment of an upper respiratory infection, presenting on 07/04/12, with sloughing of the skin of the palms and soles, 4 days after completing antibiotic course. Remembers similar events in the past after taking Amoxicillin. No chest pains shortness of breath, no history of nausea vomiting, no fever or chills. Admitted for further management.    Consultants:  N/A.   Procedures:  N/A  Antibiotics:  N/A.   HPI/Subjective: Better. Has pains both feet.   Objective: Vital signs in last 24 hours: Temp:  [97.6 F (36.4 C)-98.1 F (36.7 C)] 97.6 F (36.4 C) (01/29 0500) Pulse Rate:  [48] 48  (01/29 0500) Resp:  [16-18] 18  (01/29 0500) BP: (122-125)/(60-74) 122/74 mmHg (01/29 0500) SpO2:  [96 %-97 %] 97 % (01/29 0500) Weight change:  Last BM  Date: 07/03/12  Intake/Output from previous day: 01/28 0701 - 01/29 0700 In: 840 [P.O.:840] Out: -      Physical Exam: General: Comfortable, alert, communicative, fully oriented, not short of breath at rest.  HEENT:  No clinical pallor, no jaundice, no conjunctival injection or discharge. NECK:  Supple, JVP not seen, no carotid bruits, no palpable lymphadenopathy, no palpable goiter. Throat is clear.  CHEST:  Clinically clear to auscultation, no wheezes, no crackles. HEART:  Sounds 1 and 2 heard, normal, regular, no murmurs. ABDOMEN:  Obese, soft, non-tender, no palpable organomegaly, no palpable masses, normal bowel sounds. GENITALIA:  Not examined. LOWER EXTREMITIES: Patient has areas of peeled and desquamating sin in palms of both hands.  LOWER EXTREMITIES:  No pitting edema, palpable peripheral pulses. Soles of feet has desquamation.  MUSCULOSKELETAL SYSTEM:  Unremarkable. CENTRAL NERVOUS SYSTEM:  No focal neurologic deficit on gross examination.  Lab Results:  [facility] 07/03/12 2203  WBC 10.4  HGB 12.4  HCT 36.5  PLT 335    Basename 07/03/12 2203  NA 135  K 3.4*  CL 99  CO2 27  GLUCOSE 101*  BUN 13  CREATININE 0.69  CALCIUM 8.8   No results found for this or any previous visit (from the past 240 hour(s)).   Studies/Results: No results found.  Medications: Scheduled Meds:   . enoxaparin (LOVENOX) injection  40 mg Subcutaneous Daily  . escitalopram  20 mg Oral Daily  . levothyroxine  125 mcg Oral QAC breakfast  . methylPREDNISolone (SOLU-MEDROL) injection  60 mg Intravenous Q8H  . ranitidine  150 mg Oral BID  . sodium chloride  3 mL Intravenous Q12H   Continuous Infusions:  PRN Meds:.sodium chloride, acetaminophen, acetaminophen, diphenhydrAMINE, HYDROcodone-acetaminophen, morphine injection, ondansetron (ZOFRAN) IV, ondansetron, sodium chloride, zolpidem    LOS: 2 days   Clete Kuch,CHRISTOPHER  [facility] Pager [phone]. If 8PM-8AM, please contact  night-coverage at [email or web address], password [facility] 07/05/2012, 8:36 AM  LOS: 2 days

## 2012-07-06 LAB — BASIC METABOLIC PANEL
BUN: 18 mg/dL (ref 6–23)
Chloride: 107 mEq/L (ref 96–112)
GFR calc Af Amer: >90 mL/min (ref >90)
Potassium: 4 mEq/L (ref 3.5–5.1)

## 2012-07-06 LAB — CBC
HCT: 35.3 % — ABNORMAL LOW (ref 36.0–46.0)
Platelets: 296 10*3/uL (ref 150–400)
RBC: 3.88 MIL/uL (ref 3.87–5.11)
RDW: 12.5 % (ref 11.5–15.5)
WBC: 10.5 10*3/uL (ref 4.0–10.5)

## 2012-07-06 MED ORDER — PREDNISONE 10 MG PO TABS: ORAL_TABLET | ORAL | Status: DC

## 2012-07-06 MED ORDER — DIPHENHYDRAMINE HCL 25 MG PO CAPS: 25.0000 mg | ORAL_CAPSULE | Freq: Four times a day (QID) | ORAL | Status: DC | PRN

## 2012-07-06 MED ORDER — FAMOTIDINE 20 MG PO TABS: 20.0000 mg | ORAL_TABLET | Freq: Two times a day (BID) | ORAL | Status: DC

## 2012-07-06 MED ORDER — HYDROCODONE-ACETAMINOPHEN 5-325 MG PO TABS: 1.0000 | ORAL_TABLET | ORAL | Status: DC | PRN

## 2012-07-06 MED ORDER — HYDROCORTISONE 1 % EX CREA: TOPICAL_CREAM | Freq: Two times a day (BID) | CUTANEOUS | Status: DC

## 2012-07-06 NOTE — Discharge Summary (Signed)
Physician Discharge Summary  [name] EXB:[id number] DOB: 08/21/1970 DOA: 07/03/2012  PCP: Lilyan Punt, MD  Admit date: 07/03/2012 Discharge date: 07/06/2012  Time spent: 40 minutes  Recommendations for Outpatient Follow-up:  1. Follow up with primary MD.   Discharge Diagnoses:  Principal Problem:  *Allergic reaction Active Problems:  Hypothyroidism  Depression   Discharge Condition: Satisfactory.   Diet recommendation: Regular.   Filed Weights   07/04/12 0144  Weight: 97.523 kg (215 lb)    History of present illness:  42 y.o. female, with history significant of hypothyroidism and depression, who received Amoxicillin about 2 weeks ago, for treatment of an upper respiratory infection, presenting on 07/04/12, with sloughing of the skin of the palms and soles, 4 days after completing antibiotic course. Remembers similar events in the past after taking Amoxicillin. No chest pains shortness of breath, no history of nausea vomiting, no fever or chills. Admitted for further management.    Hospital Course:  1. Allergic reaction: Patient presented with sloughing of the skin of the palms and soles, 4 days after completing an Amoxicillin course. Early Stevens-Johnson's syndrome was deemed possible, but there appears to have been no mucosal involvement. Per patient, she has had a previous similar episode in the past, to the same medication. Amoxicillin/PCN has been placed on her list of medication allergies. She was managed with high dose IV steroids, Benadryl and H2RA, with clinical improvement. Transitioned to oral steroid taper from 07/05/12. Topical Hydrocortisone cream for palmar and plantar areas.  2. Hypothyroidism: Continued on thyroxine replacement therapy.  3. Depression: Stable on Lexapro.    Procedures:  N/A.   Consultations:  N/A.   Discharge Exam: Filed Vitals:   07/05/12 1400 07/05/12 2130 07/05/12 2354 07/06/12 0546  BP: 128/68 139/69  124/86  Pulse: 64 88  52   Temp: 97.5 F (36.4 C) 98.4 F (36.9 C) 102 F (38.9 C) 97.7 F (36.5 C)  TempSrc: Oral Oral Axillary Oral  Resp: 20 20  20   Height:      Weight:      SpO2: 97% 96%  95%    General: Comfortable, alert, communicative, fully oriented, not short of breath at rest.  HEENT: No clinical pallor, no jaundice, no conjunctival injection or discharge.  NECK: Supple, JVP not seen, no carotid bruits, no palpable lymphadenopathy, no palpable goiter. Throat is clear.  CHEST: Clinically clear to auscultation, no wheezes, no crackles.  HEART: Sounds 1 and 2 heard, normal, regular, no murmurs.  ABDOMEN: Obese, soft, non-tender, no palpable organomegaly, no palpable masses, normal bowel sounds.  GENITALIA: Not examined.  LOWER EXTREMITIES: Patient has areas of peeled and desquamating sin in palms of both hands.  LOWER EXTREMITIES: No pitting edema, palpable peripheral pulses. Soles of feet has desquamation.  MUSCULOSKELETAL SYSTEM: Unremarkable.  CENTRAL NERVOUS SYSTEM: No focal neurologic deficit on gross examination.  Discharge Instructions      Discharge Orders    Future Orders Please Complete By Expires   Diet general      Increase activity slowly          Medication List     As of 07/06/2012 10:52 AM    TAKE these medications         diphenhydrAMINE 25 mg capsule   Commonly known as: BENADRYL   Take 1 capsule (25 mg total) by mouth every 6 (six) hours as needed for itching.      escitalopram 20 MG tablet   Commonly known as: LEXAPRO   Take 20  mg by mouth daily.      famotidine 20 MG tablet   Commonly known as: PEPCID   Take 1 tablet (20 mg total) by mouth 2 (two) times daily.      HYDROcodone-acetaminophen 5-325 MG per tablet   Commonly known as: NORCO/VICODIN   Take 1 tablet by mouth every 4 (four) hours as needed for pain.      hydrocortisone cream 1 %   Apply topically 2 (two) times daily. Apply topically 2 (two) times daily. Apply to palms and soles of feet.       levothyroxine 125 MCG tablet   Commonly known as: SYNTHROID, LEVOTHROID   Take 125 mcg by mouth daily.      predniSONE 10 MG tablet   Commonly known as: DELTASONE   Take 40 mg daily for 3 days, then 30 mg daily for 3 days, then 20 mg daily for 3 days, then 10 mg daily for 3 days, then stop.         Follow-up Information    Schedule an appointment as soon as possible for a visit with Lilyan Punt, MD.   Contact information:   [address] [place] [address] [phone]           The results of significant diagnostics from this hospitalization (including imaging, microbiology, ancillary and laboratory) are listed below for reference.    Significant Diagnostic Studies: No results found.  Microbiology: No results found for this or any previous visit (from the past 240 hour(s)).   Labs: Basic Metabolic Panel:  Lab 07/06/12 9147 07/03/12 2203  NA 142 135  K 4.0 3.4*  CL 107 99  CO2 28 27  GLUCOSE 109* 101*  BUN 18 13  CREATININE 0.66 0.69  CALCIUM 8.9 8.8  MG -- --  PHOS -- --   Liver Function Tests:  Lab 07/03/12 2203  AST 13  ALT 9  ALKPHOS 72  BILITOT 0.1*  PROT 6.5  ALBUMIN 3.3*   No results found for this basename: LIPASE:5,AMYLASE:5 in the last 168 hours No results found for this basename: AMMONIA:5 in the last 168 hours CBC:  Lab 07/06/12 0455 07/03/12 2203  WBC 10.5 10.4  NEUTROABS -- 5.3  HGB 11.9* 12.4  HCT 35.3* 36.5  MCV 91.0 89.2  PLT 296 335   Cardiac Enzymes: No results found for this basename: CKTOTAL:5,CKMB:5,CKMBINDEX:5,TROPONINI:5 in the last 168 hours BNP: BNP (last 3 results) No results found for this basename: PROBNP:3 in the last 8760 hours CBG: No results found for this basename: GLUCAP:5 in the last 168 hours     Signed:  Fumio Vandam,CHRISTOPHER  [facility] 07/06/2012, 10:52 AM

## 2012-07-06 NOTE — Progress Notes (Signed)
Patient ID: [name], female   DOB: 1971-01-31, 42 y.o.   MRN: [id number] Discharge instructions reviewed with patient.  Verbalized understanding.  Prescriptions given to patient.  Discharged to home as ordered.

## 2012-10-18 ENCOUNTER — Encounter: Payer: Self-pay | Admitting: *Deleted

## 2012-10-19 ENCOUNTER — Ambulatory Visit (INDEPENDENT_AMBULATORY_CARE_PROVIDER_SITE_OTHER): Payer: BC Managed Care – PPO | Admitting: Advanced Practice Midwife

## 2012-10-19 ENCOUNTER — Encounter: Payer: Self-pay | Admitting: Advanced Practice Midwife

## 2012-10-19 VITALS — BP 120/82 | Ht 64.0 in | Wt 224.5 lb

## 2012-10-19 DIAGNOSIS — Z30431 Encounter for routine checking of intrauterine contraceptive device: Secondary | ICD-10-CM

## 2012-10-19 DIAGNOSIS — Z3202 Encounter for pregnancy test, result negative: Secondary | ICD-10-CM

## 2012-10-19 DIAGNOSIS — Z30430 Encounter for insertion of intrauterine contraceptive device: Secondary | ICD-10-CM

## 2012-10-19 LAB — POCT URINE PREGNANCY: Preg Test, Ur: NEGATIVE

## 2012-10-19 MED ORDER — LEVONORGESTREL 20 MCG/24HR IU IUD: 1.0000 | INTRAUTERINE_SYSTEM | Freq: Once | INTRAUTERINE | Status: AC

## 2012-10-19 NOTE — Progress Notes (Signed)
[name] is a 42 y.o. year old Caucasian female   who presents for removal and replacement of a Mirena IUD.  It has been 5 years since her previous IUD placement.  The risks and benefits of the method and placement have been thouroughly reviewed with the patient and all questions were answered.  Specifically the patient is aware of failure rate of 06/998, expulsion of the IUD and of possible perforation.  The patient is aware of irregular bleeding due to the method and understands the incidence of irregular bleeding diminishes with time.  Time out was performed.  A Graves speculum was placed.  The cervix was prepped using Betadine. The strings were found to be not visible.  After a little bit of difficulty, they were grasped and the Mirena was easily removed (Be Dr. Despina Hidden). The cervix was then grasped with a tenaculum and the uterus was sounded to 8 cm. The IUD was inserted to 8 cm.  It was pulled back 1 cm and the IUD was disengaged.  The strings were trimmed to 3 cm.  Sonogram was performed and the proper placement of the IUD was verified.  The patient was instructed on signs and symptoms of infection and to check for the strings after each menses or each month.  The patient is to refrain from intercourse for 3 days.

## 2012-10-19 NOTE — Patient Instructions (Signed)
Levonorgestrel intrauterine device (IUD) What is this medicine? LEVONORGESTREL IUD (LEE voe nor jes trel) is a contraceptive (birth control) device. The device is placed inside the uterus by a healthcare professional. It is used to prevent pregnancy and can also be used to treat heavy bleeding that occurs during your period. Depending on the device, it can be used for 3 to 5 years. This medicine may be used for other purposes; ask your health care provider or pharmacist if you have questions. What should I tell my health care provider before I take this medicine? They need to know if you have any of these conditions: -abnormal Pap smear -cancer of the breast, uterus, or cervix -diabetes -endometritis -genital or pelvic infection now or in the past -have more than one sexual partner or your partner has more than one partner -heart disease -history of an ectopic or tubal pregnancy -immune system problems -IUD in place -liver disease or tumor -problems with blood clots or take blood-thinners -use intravenous drugs -uterus of unusual shape -vaginal bleeding that has not been explained -an unusual or allergic reaction to levonorgestrel, other hormones, silicone, or polyethylene, medicines, foods, dyes, or preservatives -pregnant or trying to get pregnant -breast-feeding How should I use this medicine? This device is placed inside the uterus by a health care professional. Talk to your pediatrician regarding the use of this medicine in children. Special care may be needed. Overdosage: If you think you have taken too much of this medicine contact a poison control center or emergency room at once. NOTE: This medicine is only for you. Do not share this medicine with others. What if I miss a dose? This does not apply. What may interact with this medicine? Do not take this medicine with any of the following medications: -amprenavir -bosentan -fosamprenavir This medicine may also interact with  the following medications: -aprepitant -barbiturate medicines for inducing sleep or treating seizures -bexarotene -griseofulvin -medicines to treat seizures like carbamazepine, ethotoin, felbamate, oxcarbazepine, phenytoin, topiramate -modafinil -pioglitazone -rifabutin -rifampin -rifapentine -some medicines to treat HIV infection like atazanavir, indinavir, lopinavir, nelfinavir, tipranavir, ritonavir -St. John's wort -warfarin This list may not describe all possible interactions. Give your health care provider a list of all the medicines, herbs, non-prescription drugs, or dietary supplements you use. Also tell them if you smoke, drink alcohol, or use illegal drugs. Some items may interact with your medicine. What should I watch for while using this medicine? Visit your doctor or health care professional for regular check ups. See your doctor if you or your partner has sexual contact with others, becomes HIV positive, or gets a sexual transmitted disease. This product does not protect you against HIV infection (AIDS) or other sexually transmitted diseases. You can check the placement of the IUD yourself by reaching up to the top of your vagina with clean fingers to feel the threads. Do not pull on the threads. It is a good habit to check placement after each menstrual period. Call your doctor right away if you feel more of the IUD than just the threads or if you cannot feel the threads at all. The IUD may come out by itself. You may become pregnant if the device comes out. If you notice that the IUD has come out use a backup birth control method like condoms and call your health care provider. Using tampons will not change the position of the IUD and are okay to use during your period. What side effects may I notice from receiving this medicine?   Side effects that you should report to your doctor or health care professional as soon as possible: -allergic reactions like skin rash, itching or  hives, swelling of the face, lips, or tongue -fever, flu-like symptoms -genital sores -high blood pressure -no menstrual period for 6 weeks during use -pain, swelling, warmth in the leg -pelvic pain or tenderness -severe or sudden headache -signs of pregnancy -stomach cramping -sudden shortness of breath -trouble with balance, talking, or walking -unusual vaginal bleeding, discharge -yellowing of the eyes or skin Side effects that usually do not require medical attention (report to your doctor or health care professional if they continue or are bothersome): -acne -breast pain -change in sex drive or performance -changes in weight -cramping, dizziness, or faintness while the device is being inserted -headache -irregular menstrual bleeding within first 3 to 6 months of use -nausea This list may not describe all possible side effects. Call your doctor for medical advice about side effects. You may report side effects to FDA at 1-800-FDA-1088. Where should I keep my medicine? This does not apply. NOTE: This sheet is a summary. It may not cover all possible information. If you have questions about this medicine, talk to your doctor, pharmacist, or health care provider.  2013, Elsevier/Gold Standard. (06/24/2011 1:54:04 PM)  

## 2012-11-06 ENCOUNTER — Other Ambulatory Visit: Payer: Self-pay | Admitting: Adult Health

## 2012-12-06 ENCOUNTER — Encounter: Payer: Self-pay | Admitting: Adult Health

## 2012-12-06 ENCOUNTER — Ambulatory Visit (INDEPENDENT_AMBULATORY_CARE_PROVIDER_SITE_OTHER): Payer: BC Managed Care – PPO | Admitting: Adult Health

## 2012-12-06 ENCOUNTER — Other Ambulatory Visit (HOSPITAL_COMMUNITY)
Admission: RE | Admit: 2012-12-06 | Discharge: 2012-12-06 | Disposition: A | Discharge status: Home / Self Care | Payer: BC Managed Care – PPO | Source: Ambulatory Visit | Attending: Adult Health | Admitting: Adult Health

## 2012-12-06 VITALS — BP 120/80 | HR 72 | Ht 64.0 in | Wt 230.0 lb

## 2012-12-06 DIAGNOSIS — Z01419 Encounter for gynecological examination (general) (routine) without abnormal findings: Secondary | ICD-10-CM

## 2012-12-06 DIAGNOSIS — E039 Hypothyroidism, unspecified: Secondary | ICD-10-CM

## 2012-12-06 DIAGNOSIS — F419 Anxiety disorder, unspecified: Secondary | ICD-10-CM

## 2012-12-06 DIAGNOSIS — Z1151 Encounter for screening for human papillomavirus (HPV): Secondary | ICD-10-CM | POA: Insufficient documentation

## 2012-12-06 DIAGNOSIS — Z975 Presence of (intrauterine) contraceptive device: Secondary | ICD-10-CM

## 2012-12-06 HISTORY — DX: Presence of (intrauterine) contraceptive device: Z97.5

## 2012-12-06 LAB — CBC
Hemoglobin: 13.4 g/dL (ref 12.0–15.0)
RBC: 4.54 MIL/uL (ref 3.87–5.11)

## 2012-12-06 MED ORDER — CITALOPRAM HYDROBROMIDE 40 MG PO TABS: 40.0000 mg | ORAL_TABLET | Freq: Every day | ORAL | Status: DC

## 2012-12-06 NOTE — Progress Notes (Signed)
Patient ID: [name], female   DOB: 05-Sep-1970, 42 y.o.   MRN: [id number] History of Present Illness: [name] is a 42 year old white female, married in for a pap and physical. She has 2 boys and 2 foster children she is trying to adopt. 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  Current Medications, Allergies, Past Medical History, Past Surgical History, Family History and Social History were reviewed in [employer] record.     Review of Systems: Patient denies any headaches, blurred vision, shortness of breath, chest pain, abdominal pain, problems with bowel movements, urination, or intercourse. No mood changes or joint problems.    Physical Exam:BP 120/80  Pulse 72  Ht 5\' 4"  (1.626 m)  Wt 230 lb (104.327 kg)  BMI 39.46 kg/m2 General:  Well developed, well nourished, no acute distress Skin:  Warm and dry and tan Neck:  Midline trachea, normal thyroid Lungs; Clear to auscultation bilaterally Breast:  No dominant palpable mass, retraction, or nipple discharge Cardiovascular: Regular rate and rhythm Abdomen:  Soft, non tender, no hepatosplenomegaly Pelvic:  External genitalia is normal in appearance.  The vagina is normal in appearance. The cervix is bulbous, with IUD strings visible,  friable with EC brush,Pap performed with HPV Uterus is felt to be normal size, shape, and contour. No adnexal masses or tenderness noted. Extremities:  No swelling or varicosities noted Psych:  Alert and cooperative, seems happy   Impression: Yearly gyn exam Hypothyroid Anxiety/depression     Plan: Refilled celexa 40 mg 1 daily #90 with 4 refills Check CBC,CMP,TSH Physical in 1 year Mammogram at 42 and yearly

## 2012-12-06 NOTE — Patient Instructions (Addendum)
Physical in 1 year Mammogram at 68 and yearly [phone]

## 2012-12-07 ENCOUNTER — Telehealth: Payer: Self-pay | Admitting: Adult Health

## 2012-12-07 LAB — COMPREHENSIVE METABOLIC PANEL
Albumin: 4.2 g/dL (ref 3.5–5.2)
CO2: 27 mEq/L (ref 19–32)
Glucose, Bld: 86 mg/dL (ref 70–99)
Potassium: 3.9 mEq/L (ref 3.5–5.3)
Sodium: 138 mEq/L (ref 135–145)
Total Protein: 6.5 g/dL (ref 6.0–8.3)

## 2012-12-07 LAB — TSH: TSH: 0.016 u[IU]/mL — ABNORMAL LOW (ref 0.350–4.500)

## 2012-12-07 NOTE — Telephone Encounter (Signed)
Left message ok

## 2012-12-11 ENCOUNTER — Telehealth: Payer: Self-pay | Admitting: Adult Health

## 2012-12-11 NOTE — Telephone Encounter (Signed)
Left message that pap was normal but showed yeast if itching try monistat otc if not don't do anything

## 2013-04-12 ENCOUNTER — Other Ambulatory Visit: Payer: Self-pay

## 2013-06-05 ENCOUNTER — Other Ambulatory Visit: Payer: Self-pay | Admitting: Adult Health

## 2013-07-20 ENCOUNTER — Ambulatory Visit (INDEPENDENT_AMBULATORY_CARE_PROVIDER_SITE_OTHER): Payer: BC Managed Care – PPO | Admitting: Family Medicine

## 2013-07-20 ENCOUNTER — Encounter: Payer: Self-pay | Admitting: Family Medicine

## 2013-07-20 VITALS — BP 132/80 | Temp 98.5°F | Ht 64.0 in | Wt 260.0 lb

## 2013-07-20 DIAGNOSIS — J019 Acute sinusitis, unspecified: Secondary | ICD-10-CM

## 2013-07-20 DIAGNOSIS — B349 Viral infection, unspecified: Secondary | ICD-10-CM

## 2013-07-20 DIAGNOSIS — B9789 Other viral agents as the cause of diseases classified elsewhere: Secondary | ICD-10-CM

## 2013-07-20 MED ORDER — AZITHROMYCIN 250 MG PO TABS: ORAL_TABLET | ORAL | Status: DC

## 2013-07-20 MED ORDER — ALBUTEROL SULFATE HFA 108 (90 BASE) MCG/ACT IN AERS: 2.0000 | INHALATION_SPRAY | Freq: Four times a day (QID) | RESPIRATORY_TRACT | Status: DC | PRN

## 2013-07-20 NOTE — Progress Notes (Signed)
   Subjective:    Patient ID: Barbara LincolnJuli N Jaffer, female    DOB: 10/30/1970, 43 y.o.   MRN: [id number]  Cough This is a new problem. The current episode started yesterday. Associated symptoms include chest pain, rhinorrhea and a sore throat. Pertinent negatives include no ear pain, fever, shortness of breath or wheezing. Nothing aggravates the symptoms. Treatments tried: Zyrtec. The treatment provided mild relief.   pmh benign No wheeze   Review of Systems  Constitutional: Negative for fever and activity change.  HENT: Positive for congestion, rhinorrhea and sore throat. Negative for ear pain.   Eyes: Negative for discharge.  Respiratory: Positive for cough. Negative for shortness of breath and wheezing.   Cardiovascular: Positive for chest pain.       Objective:   Physical Exam  Nursing note and vitals reviewed. Constitutional: She appears well-developed.  HENT:  Head: Normocephalic.  Nose: Nose normal.  Mouth/Throat: Oropharynx is clear and moist. No oropharyngeal exudate.  Neck: Neck supple.  Cardiovascular: Normal rate and normal heart sounds.   No murmur heard. Pulmonary/Chest: Effort normal and breath sounds normal. She has no wheezes.  Lymphadenopathy:    She has no cervical adenopathy.  Skin: Skin is warm and dry.          Assessment & Plan:  Viral syndrome/secondary sinusitis/no sign of any wheezing or respiratory difficulty  Albuterol prescription given just in case patient has issues

## 2013-10-22 ENCOUNTER — Other Ambulatory Visit: Payer: Self-pay | Admitting: Adult Health

## 2013-10-31 ENCOUNTER — Encounter: Payer: Self-pay | Admitting: Nurse Practitioner

## 2013-10-31 ENCOUNTER — Ambulatory Visit (INDEPENDENT_AMBULATORY_CARE_PROVIDER_SITE_OTHER): Payer: BC Managed Care – PPO | Admitting: Nurse Practitioner

## 2013-10-31 ENCOUNTER — Ambulatory Visit (HOSPITAL_COMMUNITY)
Admission: RE | Admit: 2013-10-31 | Discharge: 2013-10-31 | Disposition: A | Discharge status: Home / Self Care | Payer: BC Managed Care – PPO | Source: Ambulatory Visit | Attending: Nurse Practitioner | Admitting: Nurse Practitioner

## 2013-10-31 VITALS — BP 122/82 | Ht 64.0 in | Wt 250.8 lb

## 2013-10-31 DIAGNOSIS — M79609 Pain in unspecified limb: Secondary | ICD-10-CM

## 2013-10-31 DIAGNOSIS — M79671 Pain in right foot: Secondary | ICD-10-CM

## 2013-10-31 NOTE — Progress Notes (Signed)
Patient notified and verbalized understanding. 

## 2013-10-31 NOTE — Progress Notes (Signed)
Subjective:  Presents complaints of localized pain on the right anterior lateral foot. Began after tripping over her dog last week. Did not think she had injured her foot but has gradually progressed into significant pain. Mild swelling yesterday, throbbing sensation in the area. Can perform weightbearing. Difficulty wearing shoes with pressure on affected area.  Objective:   BP 122/82  Ht 5\' 4"  (1.626 m)  Wt 250 lb 12.8 oz (113.762 kg)  BMI 43.03 kg/m2 NAD. Alert, oriented. Right foot: No edema or discoloration. Distinct localized tenderness noted on the anterior foot towards the lateral side, tender to light palpation. Good ROM of the ankle with tenderness in the affected area noted with flexion, somewhat with inversion.  Assessment: Right foot pain - Plan: DG Foot Complete Right  Plan: Further followup based on x-ray report.

## 2013-12-05 ENCOUNTER — Encounter: Payer: Self-pay | Admitting: Nurse Practitioner

## 2013-12-08 ENCOUNTER — Encounter: Payer: Self-pay | Admitting: Nurse Practitioner

## 2013-12-29 ENCOUNTER — Other Ambulatory Visit: Payer: Self-pay | Admitting: Adult Health

## 2014-02-26 ENCOUNTER — Other Ambulatory Visit: Payer: Self-pay | Admitting: Adult Health

## 2014-03-05 ENCOUNTER — Other Ambulatory Visit: Payer: Self-pay | Admitting: Adult Health

## 2014-04-08 ENCOUNTER — Encounter: Payer: Self-pay | Admitting: Nurse Practitioner

## 2014-04-24 ENCOUNTER — Ambulatory Visit (INDEPENDENT_AMBULATORY_CARE_PROVIDER_SITE_OTHER): Payer: BC Managed Care – PPO | Admitting: Family Medicine

## 2014-04-24 ENCOUNTER — Encounter: Payer: Self-pay | Admitting: Family Medicine

## 2014-04-24 VITALS — BP 142/94 | Temp 99.0°F | Ht 64.0 in | Wt 261.0 lb

## 2014-04-24 DIAGNOSIS — J329 Chronic sinusitis, unspecified: Secondary | ICD-10-CM

## 2014-04-24 DIAGNOSIS — J069 Acute upper respiratory infection, unspecified: Secondary | ICD-10-CM

## 2014-04-24 MED ORDER — DOXYCYCLINE HYCLATE 100 MG PO CAPS: 100.0000 mg | ORAL_CAPSULE | Freq: Two times a day (BID) | ORAL | Status: DC

## 2014-04-24 MED ORDER — ALBUTEROL SULFATE HFA 108 (90 BASE) MCG/ACT IN AERS: 2.0000 | INHALATION_SPRAY | Freq: Four times a day (QID) | RESPIRATORY_TRACT | Status: DC | PRN

## 2014-04-24 NOTE — Patient Instructions (Signed)
How to Use an Inhaler Proper inhaler technique is very important. Good technique ensures that the medicine reaches the lungs. Poor technique results in depositing the medicine on the tongue and back of the throat rather than in the airways. If you do not use the inhaler with good technique, the medicine will not help you. STEPS TO FOLLOW IF USING AN INHALER WITHOUT AN EXTENSION TUBE 1. Remove the cap from the inhaler. 2. If you are using the inhaler for the first time, you will need to prime it. Shake the inhaler for 5 seconds and release four puffs into the air, away from your face. Ask your health care provider or pharmacist if you have questions about priming your inhaler. 3. Shake the inhaler for 5 seconds before each breath in (inhalation). 4. Position the inhaler so that the top of the canister faces up. 5. Put your index finger on the top of the medicine canister. Your thumb supports the bottom of the inhaler. 6. Open your mouth. 7. Either place the inhaler between your teeth and place your lips tightly around the mouthpiece, or hold the inhaler 1-2 inches away from your open mouth. If you are unsure of which technique to use, ask your health care provider. 8. Breathe out (exhale) normally and as completely as possible. 9. Press the canister down with your index finger to release the medicine. 10. At the same time as the canister is pressed, inhale deeply and slowly until your lungs are completely filled. This should take 4-6 seconds. Keep your tongue down. 11. Hold the medicine in your lungs for 5-10 seconds (10 seconds is best). This helps the medicine get into the small airways of your lungs. 12. Breathe out slowly, through pursed lips. Whistling is an example of pursed lips. 13. Wait at least 15-30 seconds between puffs. Continue with the above steps until you have taken the number of puffs your health care provider has ordered. Do not use the inhaler more than your health care provider  tells you. 14. Replace the cap on the inhaler. 15. Follow the directions from your health care provider or the inhaler insert for cleaning the inhaler. STEPS TO FOLLOW IF USING AN INHALER WITH AN EXTENSION (SPACER) 1. Remove the cap from the inhaler. 2. If you are using the inhaler for the first time, you will need to prime it. Shake the inhaler for 5 seconds and release four puffs into the air, away from your face. Ask your health care provider or pharmacist if you have questions about priming your inhaler. 3. Shake the inhaler for 5 seconds before each breath in (inhalation). 4. Place the open end of the spacer onto the mouthpiece of the inhaler. 5. Position the inhaler so that the top of the canister faces up and the spacer mouthpiece faces you. 6. Put your index finger on the top of the medicine canister. Your thumb supports the bottom of the inhaler and the spacer. 7. Breathe out (exhale) normally and as completely as possible. 8. Immediately after exhaling, place the spacer between your teeth and into your mouth. Close your lips tightly around the spacer. 9. Press the canister down with your index finger to release the medicine. 10. At the same time as the canister is pressed, inhale deeply and slowly until your lungs are completely filled. This should take 4-6 seconds. Keep your tongue down and out of the way. 11. Hold the medicine in your lungs for 5-10 seconds (10 seconds is best). This helps the   medicine get into the small airways of your lungs. Exhale. 12. Repeat inhaling deeply through the spacer mouthpiece. Again hold that breath for up to 10 seconds (10 seconds is best). Exhale slowly. If it is difficult to take this second deep breath through the spacer, breathe normally several times through the spacer. Remove the spacer from your mouth. 13. Wait at least 15-30 seconds between puffs. Continue with the above steps until you have taken the number of puffs your health care provider has  ordered. Do not use the inhaler more than your health care provider tells you. 14. Remove the spacer from the inhaler, and place the cap on the inhaler. 15. Follow the directions from your health care provider or the inhaler insert for cleaning the inhaler and spacer. If you are using different kinds of inhalers, use your quick relief medicine to open the airways 10-15 minutes before using a steroid if instructed to do so by your health care provider. If you are unsure which inhalers to use and the order of using them, ask your health care provider, nurse, or respiratory therapist. If you are using a steroid inhaler, always rinse your mouth with water after your last puff, then gargle and spit out the water. Do not swallow the water. AVOID:  Inhaling before or after starting the spray of medicine. It takes practice to coordinate your breathing with triggering the spray.  Inhaling through the nose (rather than the mouth) when triggering the spray. HOW TO DETERMINE IF YOUR INHALER IS FULL OR NEARLY EMPTY You cannot know when an inhaler is empty by shaking it. A few inhalers are now being made with dose counters. Ask your health care provider for a prescription that has a dose counter if you feel you need that extra help. If your inhaler does not have a counter, ask your health care provider to help you determine the date you need to refill your inhaler. Write the refill date on a calendar or your inhaler canister. Refill your inhaler 7-10 days before it runs out. Be sure to keep an adequate supply of medicine. This includes making sure it is not expired, and that you have a spare inhaler.  SEEK MEDICAL CARE IF:   Your symptoms are only partially relieved with your inhaler.  You are having trouble using your inhaler.  You have some increase in phlegm. SEEK IMMEDIATE MEDICAL CARE IF:   You feel little or no relief with your inhalers. You are still wheezing and are feeling shortness of breath or  tightness in your chest or both.  You have dizziness, headaches, or a fast heart rate.  You have chills, fever, or night sweats.  You have a noticeable increase in phlegm production, or there is blood in the phlegm. MAKE SURE YOU:   Understand these instructions.  Will watch your condition.  Will get help right away if you are not doing well or get worse. Document Released: 05/21/2000 Document Revised: 03/14/2013 Document Reviewed: 12/21/2012 ExitCare Patient Information 2015 [employer]. This information is not intended to replace advice given to you by your health care provider. Make sure you discuss any questions you have with your health care provider.  

## 2014-04-24 NOTE — Progress Notes (Signed)
   Subjective:    Patient ID: [name], female    DOB: 01/18/1971, 43 y.o.   MRN: [id number]  Cough This is a new problem. Episode onset: Monday. The problem has been gradually worsening. The cough is non-productive. Associated symptoms include ear congestion and rhinorrhea. Pertinent negatives include no chest pain, ear pain, fever, shortness of breath or wheezing. Associated symptoms comments: Chest tightness. Nothing aggravates the symptoms. Treatments tried: Zyrtec. The treatment provided mild relief.   Started Monday Progressively worse Increased cough and shortness of breath   Review of Systems  Constitutional: Negative for fever and activity change.  HENT: Positive for congestion and rhinorrhea. Negative for ear pain.   Eyes: Negative for discharge.  Respiratory: Positive for cough. Negative for shortness of breath and wheezing.   Cardiovascular: Negative for chest pain.       Objective:   Physical Exam  Constitutional: She appears well-developed.  HENT:  Head: Normocephalic.  Nose: Nose normal.  Mouth/Throat: Oropharynx is clear and moist. No oropharyngeal exudate.  Neck: Neck supple.  Cardiovascular: Normal rate and normal heart sounds.   No murmur heard. Pulmonary/Chest: Effort normal and breath sounds normal. She has no wheezes.  Lymphadenopathy:    She has no cervical adenopathy.  Skin: Skin is warm and dry.  Nursing note and vitals reviewed.         Assessment & Plan:  Upper respiratory illness secondary sinusitis antibiotics prescribed warning signs discussed should gradually get better inhaler prescription given as well for the intermittent wheezing

## 2014-05-14 ENCOUNTER — Other Ambulatory Visit: Payer: Self-pay | Admitting: Adult Health

## 2014-06-18 ENCOUNTER — Other Ambulatory Visit: Payer: Self-pay | Admitting: Adult Health

## 2014-07-23 ENCOUNTER — Other Ambulatory Visit: Payer: Self-pay | Admitting: Adult Health

## 2014-09-10 ENCOUNTER — Other Ambulatory Visit: Payer: Self-pay | Admitting: Adult Health

## 2014-09-24 ENCOUNTER — Other Ambulatory Visit: Payer: Self-pay | Admitting: Adult Health

## 2014-09-24 ENCOUNTER — Other Ambulatory Visit: Payer: BLUE CROSS/BLUE SHIELD

## 2014-09-24 DIAGNOSIS — E0590 Thyrotoxicosis, unspecified without thyrotoxic crisis or storm: Secondary | ICD-10-CM

## 2014-09-25 ENCOUNTER — Telehealth: Payer: Self-pay | Admitting: Adult Health

## 2014-09-25 LAB — TSH: TSH: 0.015 u[IU]/mL — ABNORMAL LOW (ref 0.450–4.500)

## 2014-09-25 MED ORDER — LEVOTHYROXINE SODIUM 75 MCG PO TABS: ORAL_TABLET | ORAL | Status: DC

## 2014-09-25 NOTE — Telephone Encounter (Signed)
Pt aware of TSH will refill synthroid

## 2014-10-07 ENCOUNTER — Ambulatory Visit (INDEPENDENT_AMBULATORY_CARE_PROVIDER_SITE_OTHER): Payer: BLUE CROSS/BLUE SHIELD | Admitting: Adult Health

## 2014-10-07 ENCOUNTER — Encounter: Payer: Self-pay | Admitting: Adult Health

## 2014-10-07 VITALS — BP 120/90 | HR 64 | Ht 63.5 in | Wt 272.0 lb

## 2014-10-07 DIAGNOSIS — F419 Anxiety disorder, unspecified: Secondary | ICD-10-CM

## 2014-10-07 DIAGNOSIS — Z01419 Encounter for gynecological examination (general) (routine) without abnormal findings: Secondary | ICD-10-CM

## 2014-10-07 DIAGNOSIS — Z1212 Encounter for screening for malignant neoplasm of rectum: Secondary | ICD-10-CM | POA: Diagnosis not present

## 2014-10-07 DIAGNOSIS — E039 Hypothyroidism, unspecified: Secondary | ICD-10-CM

## 2014-10-07 DIAGNOSIS — Z975 Presence of (intrauterine) contraceptive device: Secondary | ICD-10-CM

## 2014-10-07 LAB — HEMOCCULT GUIAC POC 1CARD (OFFICE): Fecal Occult Blood, POC: NEGATIVE

## 2014-10-07 MED ORDER — CITALOPRAM HYDROBROMIDE 40 MG PO TABS: 40.0000 mg | ORAL_TABLET | Freq: Every day | ORAL | Status: DC

## 2014-10-07 MED ORDER — LEVOTHYROXINE SODIUM 75 MCG PO TABS: ORAL_TABLET | ORAL | Status: DC

## 2014-10-07 NOTE — Progress Notes (Signed)
Patient ID: Barbara LincolnJuli N Proctor, female   DOB: 06/27/1970, 44 y.o.   MRN: [id number] History of Present Illness: Barbara BunJuli is a 44 year old white female, married in for well woman gyn exam.She had a normal pap with negative HPV 12/06/12.   Current Medications, Allergies, Past Medical History, Past Surgical History, Family History and Social History were reviewed in [facility] electronic medical record.     Review of Systems: Patient denies any headaches, hearing loss, fatigue, blurred vision, shortness of breath, chest pain, abdominal pain, problems with bowel movements, urination, or intercourse. No joint pain or mood swings.    Physical Exam:BP 120/90 mmHg  Pulse 64  Ht 5' 3.5" (1.613 m)  Wt 272 lb (123.378 kg)  BMI 47.42 kg/m2 General:  Well developed, well nourished, no acute distress Skin:  Warm and dry Neck:  Midline trachea, normal thyroid, good ROM, no lymphadenopathy Lungs; Clear to auscultation bilaterally Breast:  No dominant palpable mass, retraction, or nipple discharge Cardiovascular: Regular rate and rhythm Abdomen:  Soft, non tender, no hepatosplenomegaly Pelvic:  External genitalia is normal in appearance, no lesions.  The vagina is normal in appearance. Urethra has no lesions or masses. The cervix is bulbous, with +IUD strings at os.  Uterus is felt to be normal size, shape, and contour.  No adnexal masses or tenderness noted.Bladder is non tender, no masses felt. Rectal: Good sphincter tone, no polyps, or hemorrhoids felt.  Hemoccult negative. Extremities/musculoskeletal:  No swelling or varicosities noted, no clubbing or cyanosis Psych:  No mood changes, alert and cooperative,seems happy   Impression: Well woman gyn exam no pap IUD in place Hypothyroidism Anxiety     Plan: Refilled synthroid x 1 year Refilled celexa 40 mg #90 1 daily with 3 refills Get mammogram now and yearly Pap and physical in 1 year with fasting labs  IUD good til 2019

## 2014-10-07 NOTE — Patient Instructions (Signed)
Get mammogram call [facility] [phone] Pap and physical in 1 year With labs in 1 year fasting

## 2014-10-09 ENCOUNTER — Ambulatory Visit (INDEPENDENT_AMBULATORY_CARE_PROVIDER_SITE_OTHER): Payer: BLUE CROSS/BLUE SHIELD | Admitting: Family Medicine

## 2014-10-09 ENCOUNTER — Encounter: Payer: Self-pay | Admitting: Family Medicine

## 2014-10-09 VITALS — BP 124/80 | Temp 98.8°F | Ht 63.75 in | Wt 217.0 lb

## 2014-10-09 DIAGNOSIS — J329 Chronic sinusitis, unspecified: Secondary | ICD-10-CM

## 2014-10-09 MED ORDER — AZITHROMYCIN 250 MG PO TABS: ORAL_TABLET | ORAL | Status: DC

## 2014-10-09 MED ORDER — ALBUTEROL SULFATE HFA 108 (90 BASE) MCG/ACT IN AERS: 2.0000 | INHALATION_SPRAY | Freq: Four times a day (QID) | RESPIRATORY_TRACT | Status: DC | PRN

## 2014-10-09 NOTE — Progress Notes (Signed)
   Subjective:    Patient ID: [name], female    DOB: 09/12/1970, 44 y.o.   MRN: [id number]  Cough This is a new problem. Episode onset: 1.5 weeks ago. Associated symptoms include headaches, nasal congestion and a sore throat. Treatments tried: advil sinus, mucinex, zyrtec.   onr and a half wk ago, mostly allergy tiype symptoms  Calmed down with advil cough and sinus  Calmed down  yest into the chest  Some night smptoms coughing  Energy level puny  Rather be sleeping  No one else sick in family    Review of Systems  HENT: Positive for sore throat.   Respiratory: Positive for cough.   Neurological: Positive for headaches.       Objective:   Physical Exam Alert moderate malaise HET Mondays congestion frontal tenderness pharynx normal neck supple lungs mild wheeze heart rare rhythm       Assessment & Plan:  Impression rhinosinusitis with element of reactive airways plan antibiotics prescribed. Symptom care discussed. Inhaler when necessary. WSL

## 2014-10-16 ENCOUNTER — Telehealth: Payer: Self-pay | Admitting: Family Medicine

## 2014-10-16 MED ORDER — CEFDINIR 300 MG PO CAPS: 300.0000 mg | ORAL_CAPSULE | Freq: Two times a day (BID) | ORAL | Status: DC

## 2014-10-16 NOTE — Telephone Encounter (Signed)
Pt seen by Dr Brett CanalesSteve on 5/4 was issued a zpak   Was told if she was not better by today to call back to have  Another antibiotic sent in   Can we do this please

## 2014-10-16 NOTE — Telephone Encounter (Signed)
omnicef 300 bid ten d 

## 2014-10-16 NOTE — Telephone Encounter (Signed)
Rx sent electronically to pharmacy. Patient notified. 

## 2015-01-15 ENCOUNTER — Telehealth: Payer: Self-pay | Admitting: *Deleted

## 2015-01-15 NOTE — Telephone Encounter (Signed)
Please go ahead and fax the form it was completed thank you

## 2015-01-15 NOTE — Telephone Encounter (Signed)
Pt called. Nurse's part of form filled out. Needs signature. Pt would like form faxed back. She does not want copy.

## 2015-01-15 NOTE — Telephone Encounter (Signed)
[facility] see form at nurse's station. Need to review med list and list meds that pt are taking.

## 2015-03-05 ENCOUNTER — Ambulatory Visit (INDEPENDENT_AMBULATORY_CARE_PROVIDER_SITE_OTHER): Payer: BLUE CROSS/BLUE SHIELD | Admitting: Family Medicine

## 2015-03-05 ENCOUNTER — Encounter: Payer: Self-pay | Admitting: Family Medicine

## 2015-03-05 VITALS — BP 110/78 | Temp 98.3°F | Ht 63.75 in | Wt 268.4 lb

## 2015-03-05 DIAGNOSIS — J019 Acute sinusitis, unspecified: Secondary | ICD-10-CM

## 2015-03-05 DIAGNOSIS — B9689 Other specified bacterial agents as the cause of diseases classified elsewhere: Secondary | ICD-10-CM

## 2015-03-05 MED ORDER — CEFDINIR 300 MG PO CAPS: 300.0000 mg | ORAL_CAPSULE | Freq: Two times a day (BID) | ORAL | Status: DC

## 2015-03-05 NOTE — Progress Notes (Signed)
   Subjective:    Patient ID: [name], female    DOB: 03/15/1971, 44 y.o.   MRN: [id number]  Sinusitis This is a new problem. The current episode started in the past 7 days. The problem has been gradually worsening since onset. There has been no fever. Associated symptoms include congestion, coughing, headaches and a sore throat. (Runny nose ) Past treatments include nothing.   Patient states no other concerns this visit. Patient relates start off sore throat congestion drainage now sinus pressure pain discomfort present in the past several days getting worse tried OTC measures without success  Review of Systems  Constitutional: Negative for activity change, appetite change and fatigue.  HENT: Positive for congestion and sore throat.   Respiratory: Positive for cough.   Cardiovascular: Negative for chest pain.  Gastrointestinal: Negative for abdominal pain.  Endocrine: Negative for polydipsia and polyphagia.  Neurological: Positive for headaches. Negative for weakness.  Psychiatric/Behavioral: Negative for confusion.       Objective:   Physical Exam  Constitutional: She appears well-nourished. No distress.  Cardiovascular: Normal rate, regular rhythm and normal heart sounds.   No murmur heard. Pulmonary/Chest: Effort normal and breath sounds normal. No respiratory distress.  Musculoskeletal: She exhibits no edema.  Lymphadenopathy:    She has no cervical adenopathy.  Neurological: She is alert. She exhibits normal muscle tone.  Psychiatric: Her behavior is normal.  Vitals reviewed.         Assessment & Plan:  Viral syndrome Secondary sinusitis Antibiotics prescribed warning signs discussed follow-up if problems.

## 2015-03-07 ENCOUNTER — Telehealth: Payer: Self-pay | Admitting: Family Medicine

## 2015-03-07 ENCOUNTER — Other Ambulatory Visit: Payer: Self-pay | Admitting: Nurse Practitioner

## 2015-03-07 MED ORDER — FLUCONAZOLE 150 MG PO TABS: ORAL_TABLET | ORAL | Status: DC

## 2015-03-07 NOTE — Telephone Encounter (Signed)
Patient needs something called in for yeast infection to [facility].

## 2015-07-18 ENCOUNTER — Ambulatory Visit (INDEPENDENT_AMBULATORY_CARE_PROVIDER_SITE_OTHER): Payer: BLUE CROSS/BLUE SHIELD | Admitting: Family Medicine

## 2015-07-18 ENCOUNTER — Encounter: Payer: Self-pay | Admitting: Family Medicine

## 2015-07-18 VITALS — BP 110/70 | Temp 98.4°F | Ht 63.75 in | Wt 270.2 lb

## 2015-07-18 DIAGNOSIS — J019 Acute sinusitis, unspecified: Secondary | ICD-10-CM

## 2015-07-18 DIAGNOSIS — A084 Viral intestinal infection, unspecified: Secondary | ICD-10-CM | POA: Diagnosis not present

## 2015-07-18 DIAGNOSIS — B9689 Other specified bacterial agents as the cause of diseases classified elsewhere: Secondary | ICD-10-CM

## 2015-07-18 MED ORDER — CEFDINIR 300 MG PO CAPS: 300.0000 mg | ORAL_CAPSULE | Freq: Two times a day (BID) | ORAL | Status: DC

## 2015-07-18 MED ORDER — PROMETHAZINE HCL 25 MG PO TABS: ORAL_TABLET | ORAL | Status: DC

## 2015-07-18 NOTE — Progress Notes (Signed)
   Subjective:    Patient ID: Elsie Lincoln, female    DOB: 1970-10-08, 45 y.o.   MRN: [id number]  Sinusitis This is a new problem. The current episode started in the past 7 days. The problem is unchanged. There has been no fever. The pain is moderate. Associated symptoms include congestion, headaches and a sore throat. (Vomiting, diarrhea, abdominal pain) Past treatments include nothing. The treatment provided no relief.    Symptoms present for the past several days with sinus pressure drainage coughing also was vomiting diarrhea and intermittent abdominal discomfort  Review of Systems  HENT: Positive for congestion and sore throat.   Neurological: Positive for headaches.   moderate nausea occasional vomiting some diarrhea as well some achiness in the joints as well as abdomen denies wheezing or difficulty breathing     Objective:   Physical Exam   Mild sinus tenderness eardrums normal throat is normal neck supple lungs clear     Assessment & Plan:  Viral syndrome secondary rhinosinusitis antibiotics prescribed warning signs discussed, Phenergan as necessary for nausea caution drowsiness

## 2015-10-11 ENCOUNTER — Other Ambulatory Visit: Payer: Self-pay | Admitting: Adult Health

## 2015-10-17 ENCOUNTER — Other Ambulatory Visit: Payer: Self-pay | Admitting: Adult Health

## 2015-11-18 ENCOUNTER — Other Ambulatory Visit: Payer: Self-pay | Admitting: Adult Health

## 2015-12-11 ENCOUNTER — Ambulatory Visit (INDEPENDENT_AMBULATORY_CARE_PROVIDER_SITE_OTHER): Payer: BC Managed Care – PPO | Admitting: Adult Health

## 2015-12-11 ENCOUNTER — Other Ambulatory Visit (HOSPITAL_COMMUNITY)
Admission: RE | Admit: 2015-12-11 | Discharge: 2015-12-11 | Disposition: A | Discharge status: Home / Self Care | Payer: BC Managed Care – PPO | Source: Ambulatory Visit | Attending: Adult Health | Admitting: Adult Health

## 2015-12-11 ENCOUNTER — Encounter: Payer: Self-pay | Admitting: Adult Health

## 2015-12-11 VITALS — BP 128/72 | HR 72 | Ht 64.0 in | Wt 281.5 lb

## 2015-12-11 DIAGNOSIS — Z01411 Encounter for gynecological examination (general) (routine) with abnormal findings: Secondary | ICD-10-CM | POA: Diagnosis not present

## 2015-12-11 DIAGNOSIS — E039 Hypothyroidism, unspecified: Secondary | ICD-10-CM

## 2015-12-11 DIAGNOSIS — Z1151 Encounter for screening for human papillomavirus (HPV): Secondary | ICD-10-CM | POA: Insufficient documentation

## 2015-12-11 DIAGNOSIS — R635 Abnormal weight gain: Secondary | ICD-10-CM

## 2015-12-11 DIAGNOSIS — Z975 Presence of (intrauterine) contraceptive device: Secondary | ICD-10-CM

## 2015-12-11 DIAGNOSIS — Z1211 Encounter for screening for malignant neoplasm of colon: Secondary | ICD-10-CM | POA: Diagnosis not present

## 2015-12-11 DIAGNOSIS — Z01419 Encounter for gynecological examination (general) (routine) without abnormal findings: Secondary | ICD-10-CM

## 2015-12-11 DIAGNOSIS — F329 Major depressive disorder, single episode, unspecified: Secondary | ICD-10-CM

## 2015-12-11 DIAGNOSIS — F32A Depression, unspecified: Secondary | ICD-10-CM

## 2015-12-11 HISTORY — DX: Abnormal weight gain: R63.5

## 2015-12-11 LAB — HEMOCCULT GUIAC POC 1CARD (OFFICE): FECAL OCCULT BLD: NEGATIVE

## 2015-12-11 MED ORDER — CITALOPRAM HYDROBROMIDE 40 MG PO TABS: 40.0000 mg | ORAL_TABLET | Freq: Every day | ORAL | Status: DC

## 2015-12-11 NOTE — Progress Notes (Signed)
Patient ID: [name] Proctor Proctor, female   DOB: 03/15/1971, 45 y.o.   MRN: [id number] History of Present Illness: [name] is a 45 year old white female,married in for a well woman gyn exam and pap.She has an IUD and is complaining of weight gain, she says she is exercising 3-5 x week and tries to eat right.She is happy with her celexa. PCP is Dr Lilyan PuntScott Luking.   Current Medications, Allergies, Past Medical History, Past Surgical History, Family History and Social History were reviewed in [facility] electronic medical record.     Review of Systems: Patient denies any headaches, hearing loss, fatigue, blurred vision, shortness of breath, chest pain, abdominal pain, problems with bowel movements, urination, or intercourse. No joint pain or mood swings.See HPI for positives.    Physical Exam:BP 128/72 mmHg  Pulse 72  Ht 5\' 4"  (1.626 m)  Wt 281 lb 8 oz (127.688 kg)  BMI 48.30 kg/m2 General:  Well developed, well nourished, no acute distress Skin:  Warm and dry,tan Neck:  Midline trachea, normal thyroid, good ROM, no lymphadenopathy Lungs; Clear to auscultation bilaterally Breast:  No dominant palpable mass, retraction, or nipple discharge Cardiovascular: Regular rate and rhythm Abdomen:  Soft, non tender, no hepatosplenomegaly,obese Pelvic:  External genitalia is normal in appearance, no lesions.  The vagina is normal in appearance. Urethra has no lesions or masses. The cervix is bulbous. +IUD strings seen, pap with HPV performed. Uterus is felt to be normal size, shape, and contour.  No adnexal masses or tenderness noted.Bladder is non tender, no masses felt. Rectal: Good sphincter tone, no polyps, or hemorrhoids felt.  Hemoccult negative. Extremities/musculoskeletal:  No swelling or varicosities noted, no clubbing or cyanosis Psych:  No mood changes, alert and cooperative,seems happy   Impression: Well woman gyn exam and pap IUD in place Hypothyroidism  Depression Weight  gain    Plan: Check CBC,CMP,TSH and lipids,A1c and vitamin D Physical in 1 year, pap in 3 if normal Mammogram now and yearly Will talk when labs back

## 2015-12-11 NOTE — Patient Instructions (Signed)
Physical in 1 year, pap if normal in 1 year Mammogram now and yearly Will talk in am

## 2015-12-12 ENCOUNTER — Telehealth: Payer: Self-pay | Admitting: Adult Health

## 2015-12-12 ENCOUNTER — Encounter: Payer: Self-pay | Admitting: Adult Health

## 2015-12-12 LAB — COMPREHENSIVE METABOLIC PANEL
A/G RATIO: 1.4 (ref 1.2–2.2)
ALT: 13 IU/L (ref 0–32)
AST: 17 IU/L (ref 0–40)
Albumin: 3.8 g/dL (ref 3.5–5.5)
Alkaline Phosphatase: 64 IU/L (ref 39–117)
BILIRUBIN TOTAL: 0.3 mg/dL (ref 0.0–1.2)
BUN/Creatinine Ratio: 12 (ref 9–23)
BUN: 9 mg/dL (ref 6–24)
CHLORIDE: 102 mmol/L (ref 96–106)
CO2: 24 mmol/L (ref 18–29)
Calcium: 9.1 mg/dL (ref 8.7–10.2)
Creatinine, Ser: 0.74 mg/dL (ref 0.57–1.00)
GFR calc Af Amer: 116 mL/min/{1.73_m2} (ref >59)
GFR calc non Af Amer: 100 mL/min/{1.73_m2} (ref >59)
Globulin, Total: 2.7 g/dL (ref 1.5–4.5)
Glucose: 89 mg/dL (ref 65–99)
POTASSIUM: 4.3 mmol/L (ref 3.5–5.2)
Sodium: 138 mmol/L (ref 134–144)
Total Protein: 6.5 g/dL (ref 6.0–8.5)

## 2015-12-12 LAB — TSH: TSH: 0.017 u[IU]/mL — ABNORMAL LOW (ref 0.450–4.500)

## 2015-12-12 LAB — LIPID PANEL
CHOL/HDL RATIO: 4.4 ratio (ref 0.0–4.4)
CHOLESTEROL TOTAL: 188 mg/dL (ref 100–199)
HDL: 43 mg/dL (ref >39)
LDL Calculated: 123 mg/dL — ABNORMAL HIGH (ref 0–99)
TRIGLYCERIDES: 112 mg/dL (ref 0–149)
VLDL Cholesterol Cal: 22 mg/dL (ref 5–40)

## 2015-12-12 LAB — HEMOGLOBIN A1C
Est. average glucose Bld gHb Est-mCnc: 111 mg/dL
HEMOGLOBIN A1C: 5.5 % (ref 4.8–5.6)

## 2015-12-12 LAB — CBC
Hematocrit: 38.4 % (ref 34.0–46.6)
Hemoglobin: 12.6 g/dL (ref 11.1–15.9)
MCH: 29 pg (ref 26.6–33.0)
MCHC: 32.8 g/dL (ref 31.5–35.7)
MCV: 88 fL (ref 79–97)
PLATELETS: 404 10*3/uL — AB (ref 150–379)
RBC: 4.35 x10E6/uL (ref 3.77–5.28)
RDW: 13.7 % (ref 12.3–15.4)
WBC: 7.8 10*3/uL (ref 3.4–10.8)

## 2015-12-12 LAB — CYTOLOGY - PAP

## 2015-12-12 LAB — VITAMIN D 25 HYDROXY (VIT D DEFICIENCY, FRACTURES): Vit D, 25-Hydroxy: 18.2 ng/mL — ABNORMAL LOW (ref 30.0–100.0)

## 2015-12-12 LAB — T4, FREE: FREE T4: 1.82 ng/dL — AB (ref 0.82–1.77)

## 2015-12-12 MED ORDER — CHOLECALCIFEROL 125 MCG (5000 UT) PO CAPS: 5000.0000 [IU] | ORAL_CAPSULE | Freq: Every day | ORAL | Status: DC

## 2015-12-12 MED ORDER — LEVOTHYROXINE SODIUM 75 MCG PO TABS: ORAL_TABLET | ORAL | Status: DC

## 2015-12-12 NOTE — Telephone Encounter (Signed)
Left message to continue synthroid, refills called in and take vitamin D3 5000 IU per day

## 2015-12-15 ENCOUNTER — Other Ambulatory Visit: Payer: Self-pay | Admitting: Adult Health

## 2015-12-15 MED ORDER — PHENTERMINE HCL 37.5 MG PO CAPS: 37.5000 mg | ORAL_CAPSULE | ORAL | Status: DC

## 2016-01-28 ENCOUNTER — Encounter: Payer: Self-pay | Admitting: Adult Health

## 2016-01-28 ENCOUNTER — Ambulatory Visit (INDEPENDENT_AMBULATORY_CARE_PROVIDER_SITE_OTHER): Payer: BC Managed Care – PPO | Admitting: Adult Health

## 2016-01-28 VITALS — BP 104/78 | HR 80 | Ht 64.0 in | Wt 275.0 lb

## 2016-01-28 DIAGNOSIS — E669 Obesity, unspecified: Secondary | ICD-10-CM

## 2016-01-28 DIAGNOSIS — Z6841 Body Mass Index (BMI) 40.0 and over, adult: Secondary | ICD-10-CM | POA: Diagnosis not present

## 2016-01-28 DIAGNOSIS — Z713 Dietary counseling and surveillance: Secondary | ICD-10-CM | POA: Diagnosis not present

## 2016-01-28 MED ORDER — PHENTERMINE HCL 37.5 MG PO CAPS: 37.5000 mg | ORAL_CAPSULE | ORAL | 0 refills | Status: DC

## 2016-01-28 NOTE — Progress Notes (Signed)
Subjective:     Patient ID: [name], female   DOB: 08/16/1970, 45 y.o.   MRN: [id number]  HPI [name] is a 45 year old white female, married in for weight and BP check since starting adipex.[place] [facility] [place] [facility] and has still lost weight.  Review of Systems Patient denies any headaches, hearing loss, fatigue, blurred vision, shortness of breath, chest pain, abdominal pain, problems with bowel movements, urination, or intercourse. No joint pain or mood swings. Reviewed past medical,surgical, social and family history. Reviewed medications and allergies.     Objective:   Physical Exam BP 104/78 (BP Location: Left Arm, Patient Position: Sitting, Cuff Size: Large)   Pulse 80   Ht 5\' 4"  (1.626 m)   Wt 275 lb (124.7 kg)   BMI 47.20 kg/m  Skin warm and dry. Lungs: clear to ausculation bilaterally. Cardiovascular: regular rate and rhythm.   She lost 6.5 lbs,praised over efforts and is exercising now too. Assessment:     Weight loss counseling, encounter for  BMI 45.0-49.9, adult (HCC)     Plan:     Refilled adipex 37.5 mg #39 take 1 daily  Follow up in 4 weeks

## 2016-01-28 NOTE — Patient Instructions (Signed)
Follow-up in 4 weeks

## 2016-02-25 ENCOUNTER — Ambulatory Visit: Payer: BC Managed Care – PPO | Admitting: Adult Health

## 2016-03-01 ENCOUNTER — Encounter: Payer: Self-pay | Admitting: Adult Health

## 2016-03-01 ENCOUNTER — Ambulatory Visit (INDEPENDENT_AMBULATORY_CARE_PROVIDER_SITE_OTHER): Payer: BC Managed Care – PPO | Admitting: Family Medicine

## 2016-03-01 ENCOUNTER — Encounter: Payer: Self-pay | Admitting: Family Medicine

## 2016-03-01 VITALS — BP 120/80 | Temp 98.6°F | Ht 63.75 in | Wt 268.4 lb

## 2016-03-01 DIAGNOSIS — J019 Acute sinusitis, unspecified: Secondary | ICD-10-CM | POA: Diagnosis not present

## 2016-03-01 DIAGNOSIS — B9689 Other specified bacterial agents as the cause of diseases classified elsewhere: Secondary | ICD-10-CM

## 2016-03-01 MED ORDER — CEFDINIR 300 MG PO CAPS: 300.0000 mg | ORAL_CAPSULE | Freq: Two times a day (BID) | ORAL | 0 refills | Status: DC

## 2016-03-01 NOTE — Progress Notes (Signed)
   Subjective:    Patient ID: [name], female    DOB: 02/09/1971, 45 y.o.   MRN: [id number]  Cough  This is a new problem. The current episode started in the past 7 days. The cough is non-productive. Associated symptoms include headaches, rhinorrhea and a sore throat. Pertinent negatives include no chest pain, ear pain, fever, shortness of breath or wheezing. Treatments tried: Mucinex DM, Zyrtec    Patient with approximate 7 days of head congestion sinus pressure drainage coughing difficult time resting denies high fever chills sweats   Review of Systems  Constitutional: Negative for activity change and fever.  HENT: Positive for congestion, rhinorrhea and sore throat. Negative for ear pain.   Eyes: Negative for discharge.  Respiratory: Positive for cough. Negative for shortness of breath and wheezing.   Cardiovascular: Negative for chest pain.  Neurological: Positive for headaches.       Objective:   Physical Exam  Constitutional: She appears well-developed.  HENT:  Head: Normocephalic.  Nose: Nose normal.  Mouth/Throat: Oropharynx is clear and moist. No oropharyngeal exudate.  Neck: Neck supple.  Cardiovascular: Normal rate and normal heart sounds.   No murmur heard. Pulmonary/Chest: Effort normal and breath sounds normal. She has no wheezes.  Lymphadenopathy:    She has no cervical adenopathy.  Skin: Skin is warm and dry.  Nursing note and vitals reviewed.         Assessment & Plan:  Patient was seen today for upper respiratory illness. It is felt that the patient is dealing with sinusitis. Antibiotics were prescribed today. Importance of compliance with medication was discussed. Symptoms should gradually resolve over the course of the next several days. If high fevers, progressive illness, difficulty breathing, worsening condition or failure for symptoms to improve over the next several days then the patient is to follow-up. If any emergent conditions the patient  is to follow-up in the emergency department otherwise to follow-up in the office. Antibiotics prescribed warning signs discussed follow-up if ongoing troubles

## 2016-03-02 ENCOUNTER — Encounter: Payer: Self-pay | Admitting: Adult Health

## 2016-03-02 ENCOUNTER — Ambulatory Visit (INDEPENDENT_AMBULATORY_CARE_PROVIDER_SITE_OTHER): Payer: BC Managed Care – PPO | Admitting: Adult Health

## 2016-03-02 VITALS — BP 124/76 | HR 70 | Ht 64.0 in | Wt 270.0 lb

## 2016-03-02 DIAGNOSIS — Z713 Dietary counseling and surveillance: Secondary | ICD-10-CM | POA: Diagnosis not present

## 2016-03-02 DIAGNOSIS — E669 Obesity, unspecified: Secondary | ICD-10-CM | POA: Diagnosis not present

## 2016-03-02 DIAGNOSIS — Z6841 Body Mass Index (BMI) 40.0 and over, adult: Secondary | ICD-10-CM | POA: Diagnosis not present

## 2016-03-02 MED ORDER — PHENTERMINE HCL 37.5 MG PO CAPS: 37.5000 mg | ORAL_CAPSULE | ORAL | 0 refills | Status: DC

## 2016-03-02 NOTE — Patient Instructions (Signed)
Continue weight loss Follow up in 4 weeks 

## 2016-03-02 NOTE — Progress Notes (Signed)
Subjective:     Patient ID: [name], female   DOB: 09/18/1970, 45 y.o.   MRN: [id number]  HPI [name] is a 45 year old white female in for weight and BP check on phentermine, and is losing.She has been sick with sinus infection and saw Dr Lilyan PuntScott Luking yesterday, and was placed on antibiotics.   Review of Systems +headaches, cough and congestion(has sinus infection)  Reviewed past medical,surgical, social and family history. Reviewed medications and allergies.     Objective:   Physical Exam BP 124/76 (BP Location: Left Arm, Patient Position: Sitting, Cuff Size: Large)   Pulse 70   Ht 5\' 4"  (1.626 m)   Wt 270 lb (122.5 kg)   BMI 46.35 kg/m  Skin warm and dry.  Lungs: clear to ausculation bilaterally. Cardiovascular: regular rate and rhythm.   Has lost 11.5 lbs total and wants to continue phentermine.  PHQ 2 score 0.  Assessment:     1. Weight loss counseling, encounter for   2. BMI 45.0-49.9, adult (HCC)       Plan:     Rx phentermine 37.5 mg #30 take 1 daily Follow up in 4 weeks

## 2016-03-30 ENCOUNTER — Ambulatory Visit: Payer: BC Managed Care – PPO | Admitting: Adult Health

## 2016-05-11 ENCOUNTER — Encounter: Payer: Self-pay | Admitting: Family Medicine

## 2016-05-11 ENCOUNTER — Ambulatory Visit (INDEPENDENT_AMBULATORY_CARE_PROVIDER_SITE_OTHER): Payer: BC Managed Care – PPO | Admitting: Family Medicine

## 2016-05-11 VITALS — BP 128/80 | Ht 64.0 in | Wt 275.0 lb

## 2016-05-11 DIAGNOSIS — M76891 Other specified enthesopathies of right lower limb, excluding foot: Secondary | ICD-10-CM

## 2016-05-11 MED ORDER — MELOXICAM 15 MG PO TABS: 15.0000 mg | ORAL_TABLET | Freq: Every day | ORAL | 2 refills | Status: DC

## 2016-05-11 NOTE — Progress Notes (Signed)
   Subjective:    Patient ID: [name], female    DOB: 04/05/1971, 45 y.o.   MRN: [id number]  Knee Pain   Incident onset: one and a half weeks ago. The pain is present in the right knee. Associated symptoms comments: Pain and swelling. She has tried elevation and NSAIDs for the symptoms. The treatment provided no relief.    medial knee pain some swelling. Tried a little bit of anti-inflammatories without much succss.   past medical history head injury to th kne approximately whenshe was age 45 had surgery is been having some probems with swelling recently   Review of Systems  knee pain denies hip or ankle pain no recent injury    Objective:   Physical Exam Lungs clear heat regular ligaments in the knees are stable there is some tederness and swelling medial portion ankle no swelling       Assessment & Plan:   knee pain- meloxicam 15 mg 1 every single day ove the course ofnet ew weeks in addition to this I recommend referral to  Orthopedics if ongoing troubles. Call if any issues.

## 2016-05-13 ENCOUNTER — Ambulatory Visit: Payer: BC Managed Care – PPO | Admitting: Nurse Practitioner

## 2016-10-12 ENCOUNTER — Other Ambulatory Visit: Payer: Self-pay | Admitting: Adult Health

## 2016-10-12 ENCOUNTER — Encounter: Payer: Self-pay | Admitting: Adult Health

## 2016-10-12 MED ORDER — FLUCONAZOLE 150 MG PO TABS: ORAL_TABLET | ORAL | 1 refills | Status: DC

## 2016-10-12 NOTE — Progress Notes (Signed)
Pt requested diflucan will rx

## 2016-11-22 ENCOUNTER — Ambulatory Visit: Payer: BC Managed Care – PPO | Admitting: Family Medicine

## 2017-01-07 ENCOUNTER — Other Ambulatory Visit: Payer: Self-pay | Admitting: Adult Health

## 2017-02-24 ENCOUNTER — Other Ambulatory Visit: Payer: Self-pay | Admitting: Adult Health

## 2017-02-28 ENCOUNTER — Ambulatory Visit (INDEPENDENT_AMBULATORY_CARE_PROVIDER_SITE_OTHER): Payer: BC Managed Care – PPO | Admitting: Adult Health

## 2017-02-28 ENCOUNTER — Encounter: Payer: Self-pay | Admitting: Adult Health

## 2017-02-28 VITALS — BP 130/68 | HR 60 | Ht 63.5 in | Wt 277.0 lb

## 2017-02-28 DIAGNOSIS — Z1211 Encounter for screening for malignant neoplasm of colon: Secondary | ICD-10-CM

## 2017-02-28 DIAGNOSIS — Z1212 Encounter for screening for malignant neoplasm of rectum: Secondary | ICD-10-CM | POA: Diagnosis not present

## 2017-02-28 DIAGNOSIS — E039 Hypothyroidism, unspecified: Secondary | ICD-10-CM | POA: Diagnosis not present

## 2017-02-28 DIAGNOSIS — Z01419 Encounter for gynecological examination (general) (routine) without abnormal findings: Secondary | ICD-10-CM | POA: Diagnosis not present

## 2017-02-28 DIAGNOSIS — F329 Major depressive disorder, single episode, unspecified: Secondary | ICD-10-CM

## 2017-02-28 DIAGNOSIS — F32A Depression, unspecified: Secondary | ICD-10-CM

## 2017-02-28 DIAGNOSIS — Z975 Presence of (intrauterine) contraceptive device: Secondary | ICD-10-CM | POA: Diagnosis not present

## 2017-02-28 DIAGNOSIS — Z30430 Encounter for insertion of intrauterine contraceptive device: Secondary | ICD-10-CM | POA: Insufficient documentation

## 2017-02-28 LAB — HEMOCCULT GUIAC POC 1CARD (OFFICE): Fecal Occult Blood, POC: NEGATIVE

## 2017-02-28 MED ORDER — CITALOPRAM HYDROBROMIDE 40 MG PO TABS: 40.0000 mg | ORAL_TABLET | Freq: Every day | ORAL | 4 refills | Status: DC

## 2017-02-28 MED ORDER — LEVOTHYROXINE SODIUM 75 MCG PO TABS: ORAL_TABLET | ORAL | 12 refills | Status: DC

## 2017-02-28 NOTE — Patient Instructions (Signed)
Change IUD in may Physical in 1 year Pap in 2020  Get mammogram now and yearly

## 2017-02-28 NOTE — Progress Notes (Signed)
Patient ID: [name], female   DOB: 19-Jun-1970, 46 y.o.   MRN: [id number] History of Present Illness: [name] is a 46 year old white female, married in for well woman gyn exam, she had a normal pap with negative HPV 12/11/15.   Current Medications, Allergies, Past Medical History, Past Surgical History, Family History and Social History were reviewed in [facility] record.     Review of Systems: Patient denies any headaches, hearing loss, fatigue, blurred vision, shortness of breath, chest pain, abdominal pain, problems with bowel movements, urination, or intercourse. No joint pain or mood swings.    Physical Exam:BP 130/68 (BP Location: Left Arm, Patient Position: Sitting, Cuff Size: Large)   Pulse 60   Ht 5' 3.5" (1.613 m)   Wt 277 lb (125.6 kg)   BMI 48.30 kg/m  General:  Well developed, well nourished, no acute distress Skin:  Warm and dry Neck:  Midline trachea, normal thyroid, good ROM, no lymphadenopathy Lungs; Clear to auscultation bilaterally Breast:  No dominant palpable mass, retraction, or nipple discharge Cardiovascular: Regular rate and rhythm Abdomen:  Soft, non tender, no hepatosplenomegaly Pelvic:  External genitalia is normal in appearance, no lesions.  The vagina is normal in appearance. Urethra has no lesions or masses. The cervix is bulbous. +IUD strings. Uterus is felt to be normal size, shape, and contour.  No adnexal masses or tenderness noted.Bladder is non tender, no masses felt. Rectal: Good sphincter tone, no polyps, or hemorrhoids felt.  Hemoccult negative. Extremities/musculoskeletal:  No swelling or varicosities noted, no clubbing or cyanosis Psych:  No mood changes, alert and cooperative,seems happy PHQ 2 score 0  Impression:   1. Encounter for well woman exam with routine gynecological exam   2. Screening for colorectal cancer   3. IUD (intrauterine device) in place   4. Hypothyroidism, unspecified type   5. Depression,  unspecified depression type       Plan:  Check Free T4 and TSH Meds ordered this encounter  Medications  . citalopram (CELEXA) 40 MG tablet    Sig: Take 1 tablet (40 mg total) by mouth daily.    Dispense:  90 tablet    Refill:  4    Order Specific Question:   Supervising Provider    Answer:   Despina Hidden, LUTHER H [[id number]]  . levothyroxine (SYNTHROID, LEVOTHROID) 75 MCG tablet    Sig: TAKE (3) TABLETS BY MOUTH EACH MORNING.    Dispense:  90 tablet    Refill:  12    Order Specific Question:   Supervising Provider    Answer:   Lazaro Arms [[id number]]  Continue vitamin D Physical in 1 year Pap in 2020 Get mammogram now and yearly IUD needs removal and insertion in May 2019.

## 2017-03-01 LAB — TSH: TSH: 1.69 u[IU]/mL (ref 0.450–4.500)

## 2017-03-01 LAB — T4, FREE: Free T4: 0.84 ng/dL (ref 0.82–1.77)

## 2017-10-26 ENCOUNTER — Ambulatory Visit: Payer: BC Managed Care – PPO | Admitting: Advanced Practice Midwife

## 2017-11-10 ENCOUNTER — Ambulatory Visit (INDEPENDENT_AMBULATORY_CARE_PROVIDER_SITE_OTHER): Payer: BC Managed Care – PPO | Admitting: Advanced Practice Midwife

## 2017-11-10 ENCOUNTER — Encounter: Payer: Self-pay | Admitting: Advanced Practice Midwife

## 2017-11-10 ENCOUNTER — Other Ambulatory Visit: Payer: Self-pay

## 2017-11-10 VITALS — BP 128/71 | HR 87 | Ht 64.0 in | Wt 290.0 lb

## 2017-11-10 DIAGNOSIS — Z30432 Encounter for removal of intrauterine contraceptive device: Secondary | ICD-10-CM

## 2017-11-10 DIAGNOSIS — Z30433 Encounter for removal and reinsertion of intrauterine contraceptive device: Secondary | ICD-10-CM

## 2017-11-10 DIAGNOSIS — Z3202 Encounter for pregnancy test, result negative: Secondary | ICD-10-CM | POA: Diagnosis not present

## 2017-11-10 DIAGNOSIS — Z30430 Encounter for insertion of intrauterine contraceptive device: Secondary | ICD-10-CM

## 2017-11-10 LAB — POCT URINE PREGNANCY: PREG TEST UR: NEGATIVE

## 2017-11-10 MED ORDER — LEVONORGESTREL 20 MCG/24HR IU IUD
INTRAUTERINE_SYSTEM | Freq: Once | INTRAUTERINE | Status: AC
  Administered 2017-11-10: 16:00:00 via INTRAUTERINE

## 2017-11-10 NOTE — Addendum Note (Signed)
Addended by: Tish FredericksonLANCASTER, Kayler Buckholtz A on: 11/10/2017 03:36 PM   Modules accepted: Orders

## 2017-11-10 NOTE — Progress Notes (Signed)
[name] is a 47 y.o. year old Caucasian female   who presents for removal and replacement of her IUD. The new IUD is Mirena. It has been 5 years since her previous IUD placement. And this is her 4th Mireana. Never bleeds! The risks and benefits of the method and placement have been thouroughly reviewed with the patient and all questions were answered.  Specifically the patient is aware of failure rate of 06/998, expulsion of the IUD and of possible perforation.  The patient is aware of irregular bleeding due to the method and understands the incidence of irregular bleeding diminishes with time.  Time out was performed.  A Graves speculum was placed.  The cervix was prepped using Betadine. The strings were found to be  visible.   They were grasped and the Mirena was easily removed. The cervix was then grasped with a tenaculum and the uterus was sounded to 8 cm. The IUD was inserted to 8 cm.  It was pulled back 1 cm and the IUD was disengaged. After 15 seconds, the IUD was placed in the fundus.  The strings were trimmed to 3 cm.  Sonogram was performed and the proper placement of the IUD was verified.  The patient was instructed on signs and symptoms of infection and to check for the strings after each menses or each month.  The patient is to refrain from intercourse for 3 days.

## 2018-04-05 ENCOUNTER — Other Ambulatory Visit: Payer: Self-pay | Admitting: Adult Health

## 2018-04-13 ENCOUNTER — Encounter: Payer: Self-pay | Admitting: Adult Health

## 2018-04-13 ENCOUNTER — Ambulatory Visit (INDEPENDENT_AMBULATORY_CARE_PROVIDER_SITE_OTHER): Payer: Commercial Managed Care - PPO | Admitting: Adult Health

## 2018-04-13 VITALS — BP 137/79 | HR 68 | Ht 64.0 in | Wt 287.5 lb

## 2018-04-13 DIAGNOSIS — Z1211 Encounter for screening for malignant neoplasm of colon: Secondary | ICD-10-CM

## 2018-04-13 DIAGNOSIS — Z01419 Encounter for gynecological examination (general) (routine) without abnormal findings: Secondary | ICD-10-CM

## 2018-04-13 DIAGNOSIS — E039 Hypothyroidism, unspecified: Secondary | ICD-10-CM

## 2018-04-13 DIAGNOSIS — Z1212 Encounter for screening for malignant neoplasm of rectum: Secondary | ICD-10-CM

## 2018-04-13 DIAGNOSIS — F32A Depression, unspecified: Secondary | ICD-10-CM

## 2018-04-13 DIAGNOSIS — F419 Anxiety disorder, unspecified: Secondary | ICD-10-CM

## 2018-04-13 DIAGNOSIS — Z975 Presence of (intrauterine) contraceptive device: Secondary | ICD-10-CM | POA: Diagnosis not present

## 2018-04-13 DIAGNOSIS — F329 Major depressive disorder, single episode, unspecified: Secondary | ICD-10-CM

## 2018-04-13 LAB — HEMOCCULT GUIAC POC 1CARD (OFFICE): FECAL OCCULT BLD: NEGATIVE

## 2018-04-13 MED ORDER — BUSPIRONE HCL 5 MG PO TABS: 5.0000 mg | ORAL_TABLET | Freq: Two times a day (BID) | ORAL | 1 refills | Status: DC

## 2018-04-13 MED ORDER — CITALOPRAM HYDROBROMIDE 40 MG PO TABS: 40.0000 mg | ORAL_TABLET | Freq: Every day | ORAL | 4 refills | Status: DC

## 2018-04-13 MED ORDER — LEVOTHYROXINE SODIUM 75 MCG PO TABS: ORAL_TABLET | ORAL | 6 refills | Status: DC

## 2018-04-13 NOTE — Progress Notes (Signed)
Patient ID: [name], female   DOB: 10-03-1970, 47 y.o.   MRN: [id number] History of Present Illness: [name] is a 47 year old white female, married, G2P2, in for well woman gyn exam, she had a normal pap with negative HPV 12/11/15.She works at [employer]. PCP is [facility].    Current Medications, Allergies, Past Medical History, Past Surgical History, Family History and Social History were reviewed in [employer] [facility] record.     Review of Systems: Patient denies any headaches, hearing loss, fatigue, blurred vision, shortness of breath, chest pain, abdominal pain, problems with bowel movements, urination, or intercourse. No joint pain or mood swings. +increase in anxiety 1-3 x a week will feel very anxious and just want to be home    Physical Exam:BP 137/79 (BP Location: Left Arm, Patient Position: Sitting, Cuff Size: Large)   Pulse 68   Ht 5\' 4"  (1.626 m)   Wt 287 lb 8 oz (130.4 kg)   BMI 49.35 kg/m  General:  Well developed, well nourished, no acute distress Skin:  Warm and dry Neck:  Midline trachea, normal thyroid, good ROM, no lymphadenopathy Lungs; Clear to auscultation bilaterally Breast:  No dominant palpable mass, retraction, or nipple discharge Cardiovascular: Regular rate and rhythm Abdomen:  Soft, non tender, no hepatosplenomegaly Pelvic:  External genitalia is normal in appearance, no lesions.  The vagina is normal in appearance. Urethra has no lesions or masses. The cervix is bulbous. +IUD strings. Uterus is felt to be normal size, shape, and contour.  No adnexal masses or tenderness noted.Bladder is non tender, no masses felt. Rectal: Good sphincter tone, no polyps, or hemorrhoids felt.  Hemoccult negative. Extremities/musculoskeletal:  No swelling or varicosities noted, no clubbing or cyanosis Psych:  No mood changes, alert and cooperative,seems happy PHQ 2 score 0. Examination chaperoned by Malachy Mood LPN.  Impression: 1. Encounter for well woman  exam with routine gynecological exam   2. Screening for colorectal cancer   3. IUD (intrauterine device) in place   4. Hypothyroidism, unspecified type   5. Depression, unspecified depression type   6. Anxiety       Plan: Check TSH and free T4 Meds ordered this encounter  Medications  . citalopram (CELEXA) 40 MG tablet    Sig: Take 1 tablet (40 mg total) by mouth daily.    Dispense:  90 tablet    Refill:  4    Order Specific Question:   Supervising Provider    Answer:   Despina Hidden, LUTHER H [[id number]]  . levothyroxine (SYNTHROID, LEVOTHROID) 75 MCG tablet    Sig: Take 3 daily    Dispense:  90 tablet    Refill:  6    Order Specific Question:   Supervising Provider    Answer:   Despina Hidden, LUTHER H [[id number]]  . busPIRone (BUSPAR) 5 MG tablet    Sig: Take 1 tablet (5 mg total) by mouth 2 (two) times daily.    Dispense:  60 tablet    Refill:  1    Order Specific Question:   Supervising Provider    Answer:   Duane Lope H [[id number]]  Pap and physical in 1 year Get mammogram  To get other labs at [facility]

## 2018-06-16 ENCOUNTER — Other Ambulatory Visit: Payer: Self-pay | Admitting: Adult Health

## 2018-06-16 MED ORDER — BUSPIRONE HCL 5 MG PO TABS: 5.0000 mg | ORAL_TABLET | Freq: Three times a day (TID) | ORAL | 1 refills | Status: DC

## 2018-06-16 NOTE — Progress Notes (Signed)
Refill buspar 

## 2018-06-22 ENCOUNTER — Other Ambulatory Visit: Payer: Self-pay | Admitting: Adult Health

## 2018-06-22 MED ORDER — BUSPIRONE HCL 15 MG PO TABS: 15.0000 mg | ORAL_TABLET | Freq: Three times a day (TID) | ORAL | 1 refills | Status: DC

## 2018-06-22 NOTE — Progress Notes (Signed)
Take buspar 15 mg tid

## 2018-07-24 ENCOUNTER — Other Ambulatory Visit (HOSPITAL_COMMUNITY): Payer: Self-pay | Admitting: Adult Health

## 2018-07-24 DIAGNOSIS — Z1231 Encounter for screening mammogram for malignant neoplasm of breast: Secondary | ICD-10-CM

## 2018-07-31 ENCOUNTER — Encounter (HOSPITAL_COMMUNITY): Payer: Self-pay

## 2018-07-31 ENCOUNTER — Ambulatory Visit (HOSPITAL_COMMUNITY)
Admission: RE | Admit: 2018-07-31 | Discharge: 2018-07-31 | Disposition: A | Discharge status: Home / Self Care | Payer: Commercial Managed Care - PPO | Source: Ambulatory Visit | Attending: Adult Health | Admitting: Adult Health

## 2018-07-31 DIAGNOSIS — Z1231 Encounter for screening mammogram for malignant neoplasm of breast: Secondary | ICD-10-CM | POA: Insufficient documentation

## 2018-11-15 ENCOUNTER — Other Ambulatory Visit: Payer: Self-pay | Admitting: Adult Health

## 2018-11-20 ENCOUNTER — Other Ambulatory Visit: Payer: Self-pay | Admitting: Adult Health

## 2018-11-20 MED ORDER — HYDROXYZINE HCL 10 MG PO TABS: 10.0000 mg | ORAL_TABLET | Freq: Three times a day (TID) | ORAL | 1 refills | Status: DC | PRN

## 2018-11-20 NOTE — Progress Notes (Signed)
Will rx vistaril 

## 2019-01-19 ENCOUNTER — Encounter: Payer: Self-pay | Admitting: Family Medicine

## 2019-01-30 ENCOUNTER — Other Ambulatory Visit: Payer: Self-pay | Admitting: Adult Health

## 2019-03-05 ENCOUNTER — Other Ambulatory Visit: Payer: Self-pay | Admitting: Adult Health

## 2019-03-05 ENCOUNTER — Other Ambulatory Visit: Payer: Self-pay | Admitting: Women's Health

## 2019-04-12 ENCOUNTER — Other Ambulatory Visit: Payer: Self-pay | Admitting: Adult Health

## 2019-04-24 ENCOUNTER — Encounter: Payer: Self-pay | Admitting: Adult Health

## 2019-04-24 ENCOUNTER — Ambulatory Visit (INDEPENDENT_AMBULATORY_CARE_PROVIDER_SITE_OTHER): Payer: Commercial Managed Care - PPO | Admitting: Adult Health

## 2019-04-24 ENCOUNTER — Other Ambulatory Visit: Payer: Self-pay

## 2019-04-24 VITALS — BP 118/73 | HR 71 | Ht 64.0 in | Wt 296.2 lb

## 2019-04-24 DIAGNOSIS — N907 Vulvar cyst: Secondary | ICD-10-CM | POA: Diagnosis not present

## 2019-04-24 MED ORDER — SULFAMETHOXAZOLE-TRIMETHOPRIM 800-160 MG PO TABS: 1.0000 | ORAL_TABLET | Freq: Two times a day (BID) | ORAL | 0 refills | Status: DC

## 2019-04-24 NOTE — Progress Notes (Signed)
  Subjective:     Patient ID: [name], female   DOB: 04/15/1971, 48 y.o.   MRN: [id number]  HPI [name] is a 48 year old white female, married, G2P2 in complaining of cyst on vulva. PCP is [facility].   Review of Systems Has cyst on right labia, has been there about a 1 week but pain for 3 days Reviewed past medical,surgical, social and family history. Reviewed medications and allergies.     Objective:   Physical Exam BP 118/73 (BP Location: Left Arm, Patient Position: Sitting, Cuff Size: Large)   Pulse 71   Ht 5\' 4"  (1.626 m)   Wt 296 lb 3.2 oz (134.4 kg)   BMI 50.84 kg/m    Skin is warm and dry Has grape sized cyst right inner labia and it is tender, but does not look infected. Dr Elonda Husky in for co exam Examination chaperoned by Rolena Infante LPN.  Assessment:     1. Vulvar cyst       Plan:     Meds ordered this encounter  Medications  . sulfamethoxazole-trimethoprim (BACTRIM DS) 800-160 MG tablet    Sig: Take 1 tablet by mouth 2 (two) times daily. Take 1 bid    Dispense:  28 tablet    Refill:  0    Order Specific Question:   Supervising Provider    Answer:   Elonda Husky, LUTHER H [[id number]]  Can alternate heat and ice Follow up in 2 weeks

## 2019-05-07 ENCOUNTER — Ambulatory Visit: Payer: Commercial Managed Care - PPO | Admitting: Adult Health

## 2019-05-21 ENCOUNTER — Other Ambulatory Visit: Payer: Self-pay | Admitting: Adult Health

## 2019-06-05 ENCOUNTER — Other Ambulatory Visit (HOSPITAL_COMMUNITY)
Admission: RE | Admit: 2019-06-05 | Discharge: 2019-06-05 | Disposition: A | Discharge status: Home / Self Care | Payer: Commercial Managed Care - PPO | Source: Ambulatory Visit | Attending: Adult Health | Admitting: Adult Health

## 2019-06-05 ENCOUNTER — Encounter: Payer: Self-pay | Admitting: Adult Health

## 2019-06-05 ENCOUNTER — Ambulatory Visit (INDEPENDENT_AMBULATORY_CARE_PROVIDER_SITE_OTHER): Payer: Commercial Managed Care - PPO | Admitting: Adult Health

## 2019-06-05 ENCOUNTER — Other Ambulatory Visit: Payer: Self-pay

## 2019-06-05 VITALS — BP 139/83 | HR 62 | Ht 64.0 in | Wt 297.5 lb

## 2019-06-05 DIAGNOSIS — Z1211 Encounter for screening for malignant neoplasm of colon: Secondary | ICD-10-CM | POA: Diagnosis not present

## 2019-06-05 DIAGNOSIS — F32A Depression, unspecified: Secondary | ICD-10-CM

## 2019-06-05 DIAGNOSIS — F329 Major depressive disorder, single episode, unspecified: Secondary | ICD-10-CM

## 2019-06-05 DIAGNOSIS — Z1212 Encounter for screening for malignant neoplasm of rectum: Secondary | ICD-10-CM | POA: Diagnosis not present

## 2019-06-05 DIAGNOSIS — Z01419 Encounter for gynecological examination (general) (routine) without abnormal findings: Secondary | ICD-10-CM | POA: Insufficient documentation

## 2019-06-05 DIAGNOSIS — Z975 Presence of (intrauterine) contraceptive device: Secondary | ICD-10-CM

## 2019-06-05 DIAGNOSIS — Z1321 Encounter for screening for nutritional disorder: Secondary | ICD-10-CM | POA: Insufficient documentation

## 2019-06-05 DIAGNOSIS — E039 Hypothyroidism, unspecified: Secondary | ICD-10-CM

## 2019-06-05 DIAGNOSIS — F419 Anxiety disorder, unspecified: Secondary | ICD-10-CM

## 2019-06-05 LAB — HEMOCCULT GUIAC POC 1CARD (OFFICE): Fecal Occult Blood, POC: NEGATIVE

## 2019-06-05 MED ORDER — BUSPIRONE HCL 15 MG PO TABS: 15.0000 mg | ORAL_TABLET | Freq: Two times a day (BID) | ORAL | 4 refills | Status: DC

## 2019-06-05 MED ORDER — LEVOTHYROXINE SODIUM 75 MCG PO TABS: ORAL_TABLET | ORAL | 3 refills | Status: DC

## 2019-06-05 MED ORDER — CITALOPRAM HYDROBROMIDE 40 MG PO TABS: 40.0000 mg | ORAL_TABLET | Freq: Every day | ORAL | 4 refills | Status: DC

## 2019-06-05 NOTE — Progress Notes (Signed)
Patient ID: [name], female   DOB: 07-10-1970, 48 y.o.   MRN: [id number] History of Present Illness: [name] is a 48 year old white female, married, G2P2 and adopted 2 in for a well woman gyn exam and pap. PCP is [facility].   Current Medications, Allergies, Past Medical History, Past Surgical History, Family History and Social History were reviewed in [facility] record.     Review of Systems:  Patient denies any headaches, hearing loss, fatigue, blurred vision, shortness of breath, chest pain, abdominal pain, problems with bowel movements, urination, or intercourse. No joint pain or mood swings.   Physical Exam:BP 139/83 (BP Location: Left Arm, Patient Position: Sitting, Cuff Size: Large)   Pulse 62   Ht 5\' 4"  (1.626 m)   Wt 297 lb 8 oz (134.9 kg)   BMI 51.07 kg/m  General:  Well developed, well nourished, no acute distress Skin:  Warm and dry Neck:  Midline trachea, normal thyroid, good ROM, no lymphadenopathy Lungs; Clear to auscultation bilaterally Breast:  No dominant palpable mass, retraction, or nipple discharge Cardiovascular: Regular rate and rhythm Abdomen:  Soft, non tender, no hepatosplenomegaly Pelvic:  External genitalia is normal in appearance, no lesions.  The vagina is normal in appearance. Urethra has no lesions or masses. The cervix is bulbous.+IUD string at os, pap with high risk HPV 16/18 genotyping performed.   Uterus is felt to be normal size, shape, and contour.  No adnexal masses or tenderness noted.Bladder is non tender, no masses felt. Rectal: Good sphincter tone, no polyps, or hemorrhoids felt.  Hemoccult negative. Extremities/musculoskeletal:  No swelling or varicosities noted, no clubbing or cyanosis Psych:  No mood changes, alert and cooperative,seems happy Fall risk is low PHQ 2 score is 0. Examination chaperoned by Levy Pupa LPN.  Impression and Plan: 1. Encounter for gynecological examination with Papanicolaou smear  of cervix Pap sent Physical in 1 year Pap in 3 if normal Mammogram yearly Check CBC,CMP,TSH and lipids  2. Screening for colorectal cancer Colonoscopy at 52  3. IUD (intrauterine device) in place  4. Hypothyroidism, unspecified type Continue synthroid  Meds ordered this encounter  Medications  . levothyroxine (SYNTHROID) 75 MCG tablet    Sig: Take 3 daily    Dispense:  90 tablet    Refill:  3    Order Specific Question:   Supervising Provider    Answer:   Elonda Husky, LUTHER H [[id number]]  . citalopram (CELEXA) 40 MG tablet    Sig: Take 1 tablet (40 mg total) by mouth daily.    Dispense:  90 tablet    Refill:  4    Order Specific Question:   Supervising Provider    Answer:   Elonda Husky, LUTHER H [[id number]]  . busPIRone (BUSPAR) 15 MG tablet    Sig: Take 1 tablet (15 mg total) by mouth 2 (two) times daily.    Dispense:  180 tablet    Refill:  4    Order Specific Question:   Supervising Provider    Answer:   Elonda Husky, LUTHER H [[id number]]    5. Anxiety Continue Buspar   6. Depression, unspecified depression type Continue celexa  7. Encounter for vitamin deficiency screening Check vitamin D

## 2019-06-12 ENCOUNTER — Other Ambulatory Visit: Payer: Self-pay | Admitting: Adult Health

## 2019-06-12 LAB — CYTOLOGY - PAP
Comment: NEGATIVE
Diagnosis: NEGATIVE
HPV 16: NEGATIVE
HPV 18 / 45: NEGATIVE
High risk HPV: POSITIVE — AB

## 2019-06-12 MED ORDER — METRONIDAZOLE 500 MG PO TABS: 500.0000 mg | ORAL_TABLET | Freq: Two times a day (BID) | ORAL | 0 refills | Status: DC

## 2019-06-12 NOTE — Progress Notes (Signed)
Had BV on pap will rx flagyl

## 2019-11-28 ENCOUNTER — Encounter: Payer: Self-pay | Admitting: Adult Health

## 2019-11-28 ENCOUNTER — Ambulatory Visit (INDEPENDENT_AMBULATORY_CARE_PROVIDER_SITE_OTHER): Payer: Commercial Managed Care - PPO | Admitting: Adult Health

## 2019-11-28 ENCOUNTER — Other Ambulatory Visit: Payer: Self-pay

## 2019-11-28 VITALS — BP 114/63 | HR 68 | Ht 64.0 in | Wt 281.0 lb

## 2019-11-28 DIAGNOSIS — Z713 Dietary counseling and surveillance: Secondary | ICD-10-CM

## 2019-11-28 DIAGNOSIS — Z6841 Body Mass Index (BMI) 40.0 and over, adult: Secondary | ICD-10-CM

## 2019-11-28 MED ORDER — PHENTERMINE HCL 37.5 MG PO CAPS: 37.5000 mg | ORAL_CAPSULE | ORAL | 0 refills | Status: DC

## 2019-11-28 NOTE — Progress Notes (Signed)
°  Subjective:     Patient ID: Elsie Lincoln, female   DOB: [birth year], 49 y.o.   MRN: [id number]  HPI Danille is a 49 year old white female, married, G2P2 in to discuss getting on adipex, has lost 18 lbs on her own and is at stand still. PCP is [facility].  Review of Systems Lost 18 lbs on her own now at stand still Reviewed past medical,surgical, social and family history. Reviewed medications and allergies.     Objective:   Physical Exam BP 114/63 (BP Location: Right Arm, Patient Position: Sitting, Cuff Size: Large)    Pulse 68    Ht 5\' 4"  (1.626 m)    Wt 281 lb (127.5 kg)    BMI 48.23 kg/m  Skin warm and dry.  Lungs: clear to ausculation bilaterally. Cardiovascular: regular rate and rhythm.    Assessment:     1. Weight loss counseling, encounter for Increase water Decrease portions Will rx adipex Meds ordered this encounter  Medications   phentermine 37.5 MG capsule    Sig: Take 1 capsule (37.5 mg total) by mouth every morning.    Dispense:  30 capsule    Refill:  0    Order Specific Question:   Supervising Provider    Answer:   , LUTHER H [[id number]]    2. BMI 45.0-49.9, adult (HCC) Get labs at work    Plan:     Follow up in 4 weeks for weight and BP check

## 2019-12-03 ENCOUNTER — Telehealth: Payer: Self-pay | Admitting: Adult Health

## 2019-12-03 NOTE — Telephone Encounter (Signed)
Left message that I received her labs, thyroid is normal , vitamin D low so make sure taking it daily now, other labs looked good

## 2019-12-26 ENCOUNTER — Ambulatory Visit (INDEPENDENT_AMBULATORY_CARE_PROVIDER_SITE_OTHER): Payer: Commercial Managed Care - PPO | Admitting: Adult Health

## 2019-12-26 ENCOUNTER — Encounter: Payer: Self-pay | Admitting: Adult Health

## 2019-12-26 VITALS — BP 120/74 | HR 78 | Ht 64.0 in | Wt 275.0 lb

## 2019-12-26 DIAGNOSIS — Z713 Dietary counseling and surveillance: Secondary | ICD-10-CM

## 2019-12-26 DIAGNOSIS — Z6841 Body Mass Index (BMI) 40.0 and over, adult: Secondary | ICD-10-CM | POA: Diagnosis not present

## 2019-12-26 MED ORDER — PHENTERMINE HCL 37.5 MG PO CAPS: 37.5000 mg | ORAL_CAPSULE | ORAL | 0 refills | Status: DC

## 2019-12-26 NOTE — Progress Notes (Signed)
  Subjective:     Patient ID: Elsie Lincoln, female   DOB: 05-23-1971, 49 y.o.   MRN: [id number]  HPI Lexine is a 49 year old white female, married, has  IUD in for weight and BP check after starting adipex. PCP is [facility]  Review of Systems Has lost  Weight even on vacation Has some dry mouth  Reviewed past medical,surgical, social and family history. Reviewed medications and allergies.     Objective:   Physical Exam BP 120/74 (BP Location: Left Arm, Patient Position: Sitting, Cuff Size: Normal)   Pulse 78   Ht 5\' 4"  (1.626 m)   Wt 275 lb (124.7 kg)   BMI 47.20 kg/m  Skin warm and dry.  Lungs: clear to ausculation bilaterally. Cardiovascular: regular rate and rhythm.   Lost 6 lbs  Assessment:     1. Weight loss counseling, encounter for   2. BMI 45.0-49.9, adult [facility])       Plan:     Will continue adipex Meds ordered this encounter  Medications  . phentermine 37.5 MG capsule    Sig: Take 1 capsule (37.5 mg total) by mouth every morning.    Dispense:  30 capsule    Refill:  0    Order Specific Question:   Supervising Provider    Answer:   [facility] H [[id number]]  Follow up in 4 weeks for weight and BP check

## 2020-01-23 ENCOUNTER — Ambulatory Visit: Payer: Commercial Managed Care - PPO | Admitting: Adult Health

## 2020-01-24 ENCOUNTER — Other Ambulatory Visit: Payer: Self-pay | Admitting: Adult Health

## 2020-01-24 MED ORDER — LORAZEPAM 0.5 MG PO TABS: 0.5000 mg | ORAL_TABLET | Freq: Three times a day (TID) | ORAL | 0 refills | Status: DC

## 2020-01-24 NOTE — Progress Notes (Signed)
Will rx ativan  

## 2020-01-31 ENCOUNTER — Ambulatory Visit (INDEPENDENT_AMBULATORY_CARE_PROVIDER_SITE_OTHER): Payer: Commercial Managed Care - PPO | Admitting: Adult Health

## 2020-01-31 ENCOUNTER — Encounter: Payer: Self-pay | Admitting: Adult Health

## 2020-01-31 VITALS — BP 115/73 | HR 80 | Ht 64.0 in | Wt 266.8 lb

## 2020-01-31 DIAGNOSIS — Z713 Dietary counseling and surveillance: Secondary | ICD-10-CM

## 2020-01-31 DIAGNOSIS — Z6841 Body Mass Index (BMI) 40.0 and over, adult: Secondary | ICD-10-CM

## 2020-01-31 MED ORDER — PHENTERMINE HCL 37.5 MG PO CAPS: 37.5000 mg | ORAL_CAPSULE | ORAL | 0 refills | Status: DC

## 2020-01-31 NOTE — Progress Notes (Signed)
  Subjective:     Patient ID: [name], female   DOB: [birth year], 49 y.o.   MRN: [id number]  HPI [name] is a 49 year old white female, married, G2P2, with 2 adopted kids, in for weight and BP check, she is on adipex sinec June and has lost about 15 lbs and since starting weight loss in May about 30 lbs. Her oldest adopted daughter left home and she has been worried, but knows she is with her biological sister, but she is not going to school. She has not had to take ativan yet, but has it and vistaril if needed.  PCP is Dr Lilyan Punt.  Review of Systems +weight loss Not teary as before Sleeping better Reviewed past medical,surgical, social and family history. Reviewed medications and allergies.     Objective:   Physical Exam BP 115/73 (BP Location: Right Arm, Patient Position: Sitting, Cuff Size: Large)   Pulse 80   Ht 5\' 4"  (1.626 m)   Wt 266 lb 12.8 oz (121 kg)   BMI 45.80 kg/m     Skin warm and dry. Lungs: clear to ausculation bilaterally. Cardiovascular: regular rate and rhythm.   Assessment:     1. Weight loss counseling, encounter for Will continue adipex Meds ordered this encounter  Medications  . phentermine 37.5 MG capsule    Sig: Take 1 capsule (37.5 mg total) by mouth every morning.    Dispense:  30 capsule    Refill:  0    Order Specific Question:   Supervising Provider    Answer:   , LUTHER H [[id number]]    2. BMI 45.0-49.9, adult (HCC)     Plan:     Follow up in 4 weeks

## 2020-02-06 ENCOUNTER — Institutional Professional Consult (permissible substitution): Payer: Commercial Managed Care - PPO | Admitting: Plastic Surgery

## 2020-02-19 ENCOUNTER — Other Ambulatory Visit: Payer: Self-pay | Admitting: Adult Health

## 2020-02-27 ENCOUNTER — Ambulatory Visit: Payer: Commercial Managed Care - PPO | Admitting: Adult Health

## 2020-04-07 ENCOUNTER — Other Ambulatory Visit: Payer: Self-pay | Admitting: Adult Health

## 2020-05-23 ENCOUNTER — Other Ambulatory Visit: Payer: Self-pay | Admitting: Women's Health

## 2020-05-26 ENCOUNTER — Other Ambulatory Visit: Payer: Self-pay | Admitting: Adult Health

## 2020-06-25 ENCOUNTER — Other Ambulatory Visit: Payer: Self-pay | Admitting: Adult Health

## 2020-06-25 MED ORDER — FLUCONAZOLE 150 MG PO TABS: ORAL_TABLET | ORAL | 1 refills | Status: DC

## 2020-06-25 MED ORDER — LEVOTHYROXINE SODIUM 75 MCG PO TABS: 225.0000 ug | ORAL_TABLET | Freq: Every day | ORAL | 1 refills | Status: DC

## 2020-06-25 NOTE — Progress Notes (Signed)
Will rx diflucan for yeast and refill levothyroxine til appt

## 2020-07-17 ENCOUNTER — Other Ambulatory Visit: Payer: Commercial Managed Care - PPO | Admitting: Adult Health

## 2020-09-10 ENCOUNTER — Other Ambulatory Visit: Payer: Commercial Managed Care - PPO | Admitting: Adult Health

## 2020-09-24 ENCOUNTER — Other Ambulatory Visit: Payer: Self-pay | Admitting: Adult Health

## 2020-10-16 ENCOUNTER — Other Ambulatory Visit: Payer: Self-pay

## 2020-10-16 ENCOUNTER — Ambulatory Visit (INDEPENDENT_AMBULATORY_CARE_PROVIDER_SITE_OTHER): Payer: Commercial Managed Care - PPO | Admitting: Adult Health

## 2020-10-16 ENCOUNTER — Encounter: Payer: Self-pay | Admitting: Adult Health

## 2020-10-16 ENCOUNTER — Other Ambulatory Visit (HOSPITAL_COMMUNITY)
Admission: RE | Admit: 2020-10-16 | Discharge: 2020-10-16 | Disposition: A | Discharge status: Home / Self Care | Payer: Commercial Managed Care - PPO | Source: Ambulatory Visit | Attending: Adult Health | Admitting: Adult Health

## 2020-10-16 VITALS — BP 133/76 | HR 76 | Ht 64.0 in | Wt 293.4 lb

## 2020-10-16 DIAGNOSIS — Z1212 Encounter for screening for malignant neoplasm of rectum: Secondary | ICD-10-CM

## 2020-10-16 DIAGNOSIS — Z975 Presence of (intrauterine) contraceptive device: Secondary | ICD-10-CM

## 2020-10-16 DIAGNOSIS — Z01419 Encounter for gynecological examination (general) (routine) without abnormal findings: Secondary | ICD-10-CM | POA: Diagnosis not present

## 2020-10-16 DIAGNOSIS — E039 Hypothyroidism, unspecified: Secondary | ICD-10-CM

## 2020-10-16 DIAGNOSIS — Z1231 Encounter for screening mammogram for malignant neoplasm of breast: Secondary | ICD-10-CM | POA: Diagnosis not present

## 2020-10-16 DIAGNOSIS — Z1211 Encounter for screening for malignant neoplasm of colon: Secondary | ICD-10-CM | POA: Diagnosis not present

## 2020-10-16 DIAGNOSIS — F32A Depression, unspecified: Secondary | ICD-10-CM

## 2020-10-16 DIAGNOSIS — F419 Anxiety disorder, unspecified: Secondary | ICD-10-CM

## 2020-10-16 LAB — HEMOCCULT GUIAC POC 1CARD (OFFICE): Fecal Occult Blood, POC: NEGATIVE

## 2020-10-16 MED ORDER — CITALOPRAM HYDROBROMIDE 40 MG PO TABS: 40.0000 mg | ORAL_TABLET | Freq: Every day | ORAL | 3 refills | Status: DC

## 2020-10-16 MED ORDER — BUSPIRONE HCL 15 MG PO TABS: 15.0000 mg | ORAL_TABLET | Freq: Two times a day (BID) | ORAL | 4 refills | Status: DC

## 2020-10-16 NOTE — Progress Notes (Signed)
Patient ID: [name], female   DOB: 10/05/1970, 50 y.o.   MRN: [id number] History of Present Illness: [name] is a 50 year old white female, married, G2P2 in for well woman gyn exam and pap. PCP is [facility].   Current Medications, Allergies, Past Medical History, Past Surgical History, Family History and Social History were reviewed in [employer] record.     Review of Systems:  Patient denies any headaches, hearing loss, fatigue, blurred vision, shortness of breath, chest pain, abdominal pain, problems with bowel movements, urination, or intercourse. No joint pain or mood swings. Rare hot flash  No periods with IUD Has gained weight    Physical Exam:BP 133/76 (BP Location: Right Arm, Patient Position: Sitting, Cuff Size: Large)   Pulse 76   Ht 5\' 4"  (1.626 m)   Wt 293 lb 6.4 oz (133.1 kg)   LMP  (LMP Unknown)   BMI 50.36 kg/m  General:  Well developed, well nourished, no acute distress Skin:  Warm and dry Neck:  Midline trachea, normal thyroid, good ROM, no lymphadenopathy Lungs; Clear to auscultation bilaterally Breast:  No dominant palpable mass, retraction, or nipple discharge Cardiovascular: Regular rate and rhythm Abdomen:  Soft, non tender, no hepatosplenomegaly Pelvic:  External genitalia is normal in appearance, no lesions.  The vagina is normal in appearance. Urethra has no lesions or masses. The cervix is bulbous,+short IUD string x1, pap with HR HPV genotyping performed.  Uterus is felt to be normal size, shape, and contour.  No adnexal masses or tenderness noted.Bladder is non tender, no masses felt. Rectal: Good sphincter tone, no polyps, or hemorrhoids felt.  Hemoccult negative. Extremities/musculoskeletal:  No swelling or varicosities noted, no clubbing or cyanosis Psych:  No mood changes, alert and cooperative,seems happy AA is 1 Fall risk is low PHQ 9 score is 8 GAD 7 score is 6  Upstream - 10/16/20 1341      Pregnancy Intention  Screening   Does the patient want to become pregnant in the next year? No    Does the patient's partner want to become pregnant in the next year? No    Would the patient like to discuss contraceptive options today? No      Contraception Wrap Up   Current Method IUD or IUS    End Method IUD or IUS    Contraception Counseling Provided No         Examination chaperoned by 12/16/20.   Impression and Plan:  1. Encounter for gynecological examination with Papanicolaou smear of cervix Pap sent Physical in 1 year Pap in 3 if normal Had labs at work, but not thyroid   2. Screening mammogram for breast cancer She will call for appt.  3. Encounter for screening fecal occult blood testing  4. IUD (intrauterine device) in place Placed 11/10/17  5. Hypothyroidism, unspecified type Check TSH and free T4 -continue synthroid has refills   6. Anxiety On Buspar and Celexa   7. Depression, unspecified depression type On celexa Meds ordered this encounter  Medications  . busPIRone (BUSPAR) 15 MG tablet    Sig: Take 1 tablet (15 mg total) by mouth 2 (two) times daily.    Dispense:  180 tablet    Refill:  4    Order Specific Question:   Supervising Provider    Answer:   01/10/18, LUTHER H [[id number]]  . citalopram (CELEXA) 40 MG tablet    Sig: Take 1 tablet (40 mg total) by mouth daily.  Dispense:  90 tablet    Refill:  3    Order Specific Question:   Supervising Provider    Answer:   Despina Hidden, LUTHER H [[id number]]    8. Screening for colorectal cancer Referred to Dr Marletta Lor   9. Morbid obesity (HCC) She is interested saxenda, to talk with Sherie Don NP

## 2020-10-17 LAB — T4, FREE: Free T4: 1.48 ng/dL (ref 0.82–1.77)

## 2020-10-17 LAB — TSH: TSH: 0.111 u[IU]/mL — ABNORMAL LOW (ref 0.450–4.500)

## 2020-10-20 ENCOUNTER — Other Ambulatory Visit: Payer: Self-pay | Admitting: Adult Health

## 2020-10-20 MED ORDER — LEVOTHYROXINE SODIUM 200 MCG PO TABS: 200.0000 ug | ORAL_TABLET | Freq: Every day | ORAL | 11 refills | Status: DC

## 2020-10-20 NOTE — Progress Notes (Signed)
Will decrease synthroid to 200 mcg

## 2020-10-21 LAB — CYTOLOGY - PAP
Comment: NEGATIVE
Diagnosis: NEGATIVE
High risk HPV: NEGATIVE

## 2020-11-14 ENCOUNTER — Encounter: Payer: Self-pay | Admitting: *Deleted

## 2021-03-09 ENCOUNTER — Ambulatory Visit: Payer: Commercial Managed Care - PPO | Admitting: Gastroenterology

## 2021-03-09 NOTE — Progress Notes (Signed)
Referring Provider:Griffin, Ginette Pitman, NP Primary Care Physician:  Babs Sciara, MD Primary Gastroenterologist:  Dr. Marletta Lor  Chief Complaint  Patient presents with   Colonoscopy    Never had tcs. No fhcrc. No problems    HPI:   [name] is a 50 y.o. female presenting today at the request of Adline Potter, NP for colon cancer screening.   Recent fecal occult blood test in May was negative.  Today: No prior colonoscopy.  No family history of colon cancer.  No GI concerns today.  Denies abdominal pain, constipation, diarrhea, BRBPR, melena, unintentional weight loss, nausea, vomiting,   History of GERD, on Prilosec 20 mg daily which keeps her symptoms well controlled.  Denies dysphagia.  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 180 tablet 4   Cholecalciferol 5000 units capsule Take 1 capsule (5,000 Units total) by mouth daily.     citalopram (CELEXA) 40 MG tablet Take 1 tablet (40 mg total) by mouth daily. 90 tablet 3   levonorgestrel (MIRENA) 20 MCG/24HR IUD 1 Intra Uterine Device (1 each total) by Intrauterine route once. 1 each 0   levothyroxine (SYNTHROID) 200 MCG tablet Take 1 tablet (200 mcg total) by mouth daily. 30 tablet 11   omeprazole (PRILOSEC) 20 MG capsule Take 20 mg by mouth daily.     No current facility-administered medications for this visit.    Allergies as of 03/11/2021 - Review Complete 03/11/2021  Allergen Reaction Noted    Amoxicillin Other (See Comments) 07/03/2012   Actifed cold-allergy [chlorpheniramine-phenyleph er]  07/03/2012   Penicillins  07/03/2012    Family History  Problem Relation Age of Onset   Hypothyroidism Mother    Hypertension Father    Cancer Father        skin, lymph nodes   Alzheimer's disease Maternal Grandmother    Diabetes Paternal Grandmother    Heart disease Paternal Grandmother        CAD   Asthma Son    Allergies Son    Eczema Son    Colon cancer Neg Hx     Social History   Socioeconomic History   Marital status: Married    Spouse name: Not on file   Number of children: 4   Years of education: Not on file   Highest education level: Not on file  Occupational History   Not on file  Tobacco Use   Smoking status: Never   Smokeless tobacco: Never  Vaping Use   Vaping Use: Never used  Substance and Sexual Activity   Alcohol use: Yes    Comment: approx 1 drink/month   Drug use: No   Sexual activity: Yes    Birth control/protection: I.U.D.  Other Topics Concern   Not on file  Social History Narrative   Not on file   Social Determinants of Health  Financial Resource Strain: Low Risk    Difficulty of Paying Living Expenses: Not hard at all  Food Insecurity: No Food Insecurity   Worried About Programme researcher, broadcasting/film/video in the Last Year: Never true   Ran Out of Food in the Last Year: Never true  Transportation Needs: No Transportation Needs   Lack of Transportation (Medical): No   Lack of Transportation (Non-Medical): No  Physical Activity: Insufficiently Active   Days of Exercise per Week: 1 day   Minutes of Exercise per Session: 40 min  Stress: No Stress Concern Present   Feeling of Stress : Only a little  Social Connections: Moderately Integrated   Frequency of Communication with Friends and Family: More than three times a week   Frequency of Social Gatherings with Friends and Family: Three times a week   Attends Religious Services: More than 4 times per  year   Active Member of Clubs or Organizations: No   Attends Banker Meetings: Never   Marital Status: Married  Catering manager Violence: Not At Risk   Fear of Current or Ex-Partner: No   Emotionally Abused: No   Physically Abused: No   Sexually Abused: No    Review of Systems: Gen: Denies any fever, chills, cold or flulike symptoms, presyncope, syncope. CV: Denies chest pain, heart palpitations. Resp: Denies shortness of breath or cough. GI: See HPI GU : Denies urinary burning, urinary frequency, urinary hesitancy MS: Denies joint pain Derm: Denies rash Psych: Admits to anxiety. Heme: See HPI  Physical Exam: BP 135/82   Pulse 69   Temp (!) 97.3 F (36.3 C) (Temporal)   Ht 5\' 4"  (1.626 m)   Wt 288 lb 6.4 oz (130.8 kg)   BMI 49.50 kg/m  General:   Alert and oriented. Pleasant and cooperative. Well-nourished and well-developed.  Head:  Normocephalic and atraumatic. Eyes:  Without icterus, sclera clear and conjunctiva pink.  Ears:  Normal auditory acuity. Lungs:  Clear to auscultation bilaterally. No wheezes, rales, or rhonchi. No distress.  Heart:  S1, S2 present without murmurs appreciated.  Abdomen:  +BS, soft, non-tender and non-distended. No HSM noted. No guarding or rebound. No masses appreciated.  Rectal:  Deferred  Msk:  Symmetrical without gross deformities. Normal posture. Extremities:  Without edema. Neurologic:  Alert and  oriented x4;  grossly normal neurologically. Skin:  Intact without significant lesions or rashes. Psych:  Normal mood and affect.    Assessment: 50 year old female with history of anxiety, hypothyroidism, obesity, gastric sleeve, GERD well-controlled on PPI daily who is presenting today to schedule first-ever screening colonoscopy.  She has no significant upper or lower GI symptoms.  No alarm symptoms.  No family history of colon cancer.   Plan: Proceed with colonoscopy with propofol with Dr. 52 in the near future. The  risks, benefits, and alternatives have been discussed with the patient in detail. The patient states understanding and desires to proceed. ASA III Pregnancy test at preop Advised patient to reach out to her insurance company to ensure they cover screening colonoscopy before age 58. Follow-up as needed.   44, PA-C [facility] 03/11/2021

## 2021-03-09 NOTE — H&P (View-Only) (Signed)
Referring Provider:Griffin, Ginette Pitman, NP Primary Care Physician:  Babs Sciara, MD Primary Gastroenterologist:  Dr. Marletta Lor  Chief Complaint  Patient presents with   Colonoscopy    Never had tcs. No fhcrc. No problems    HPI:   [name] is a 50 y.o. female presenting today at the request of Adline Potter, NP for colon cancer screening.   Recent fecal occult blood test in May was negative.  Today: No prior colonoscopy.  No family history of colon cancer.  No GI concerns today.  Denies abdominal pain, constipation, diarrhea, BRBPR, melena, unintentional weight loss, nausea, vomiting,   History of GERD, on Prilosec 20 mg daily which keeps her symptoms well controlled.  Denies dysphagia.  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 180 tablet 4   Cholecalciferol 5000 units capsule Take 1 capsule (5,000 Units total) by mouth daily.     citalopram (CELEXA) 40 MG tablet Take 1 tablet (40 mg total) by mouth daily. 90 tablet 3   levonorgestrel (MIRENA) 20 MCG/24HR IUD 1 Intra Uterine Device (1 each total) by Intrauterine route once. 1 each 0   levothyroxine (SYNTHROID) 200 MCG tablet Take 1 tablet (200 mcg total) by mouth daily. 30 tablet 11   omeprazole (PRILOSEC) 20 MG capsule Take 20 mg by mouth daily.     No current facility-administered medications for this visit.    Allergies as of 03/11/2021 - Review Complete 03/11/2021  Allergen Reaction Noted    Amoxicillin Other (See Comments) 07/03/2012   Actifed cold-allergy [chlorpheniramine-phenyleph er]  07/03/2012   Penicillins  07/03/2012    Family History  Problem Relation Age of Onset   Hypothyroidism Mother    Hypertension Father    Cancer Father        skin, lymph nodes   Alzheimer's disease Maternal Grandmother    Diabetes Paternal Grandmother    Heart disease Paternal Grandmother        CAD   Asthma Son    Allergies Son    Eczema Son    Colon cancer Neg Hx     Social History   Socioeconomic History   Marital status: Married    Spouse name: Not on file   Number of children: 4   Years of education: Not on file   Highest education level: Not on file  Occupational History   Not on file  Tobacco Use   Smoking status: Never   Smokeless tobacco: Never  Vaping Use   Vaping Use: Never used  Substance and Sexual Activity   Alcohol use: Yes    Comment: approx 1 drink/month   Drug use: No   Sexual activity: Yes    Birth control/protection: I.U.D.  Other Topics Concern   Not on file  Social History Narrative   Not on file   Social Determinants of Health  Financial Resource Strain: Low Risk    Difficulty of Paying Living Expenses: Not hard at all  Food Insecurity: No Food Insecurity   Worried About Programme researcher, broadcasting/film/video in the Last Year: Never true   Ran Out of Food in the Last Year: Never true  Transportation Needs: No Transportation Needs   Lack of Transportation (Medical): No   Lack of Transportation (Non-Medical): No  Physical Activity: Insufficiently Active   Days of Exercise per Week: 1 day   Minutes of Exercise per Session: 40 min  Stress: No Stress Concern Present   Feeling of Stress : Only a little  Social Connections: Moderately Integrated   Frequency of Communication with Friends and Family: More than three times a week   Frequency of Social Gatherings with Friends and Family: Three times a week   Attends Religious Services: More than 4 times per  year   Active Member of Clubs or Organizations: No   Attends Banker Meetings: Never   Marital Status: Married  Catering manager Violence: Not At Risk   Fear of Current or Ex-Partner: No   Emotionally Abused: No   Physically Abused: No   Sexually Abused: No    Review of Systems: Gen: Denies any fever, chills, cold or flulike symptoms, presyncope, syncope. CV: Denies chest pain, heart palpitations. Resp: Denies shortness of breath or cough. GI: See HPI GU : Denies urinary burning, urinary frequency, urinary hesitancy MS: Denies joint pain Derm: Denies rash Psych: Admits to anxiety. Heme: See HPI  Physical Exam: BP 135/82   Pulse 69   Temp (!) 97.3 F (36.3 C) (Temporal)   Ht 5\' 4"  (1.626 m)   Wt 288 lb 6.4 oz (130.8 kg)   BMI 49.50 kg/m  General:   Alert and oriented. Pleasant and cooperative. Well-nourished and well-developed.  Head:  Normocephalic and atraumatic. Eyes:  Without icterus, sclera clear and conjunctiva pink.  Ears:  Normal auditory acuity. Lungs:  Clear to auscultation bilaterally. No wheezes, rales, or rhonchi. No distress.  Heart:  S1, S2 present without murmurs appreciated.  Abdomen:  +BS, soft, non-tender and non-distended. No HSM noted. No guarding or rebound. No masses appreciated.  Rectal:  Deferred  Msk:  Symmetrical without gross deformities. Normal posture. Extremities:  Without edema. Neurologic:  Alert and  oriented x4;  grossly normal neurologically. Skin:  Intact without significant lesions or rashes. Psych:  Normal mood and affect.    Assessment: 50 year old female with history of anxiety, hypothyroidism, obesity, gastric sleeve, GERD well-controlled on PPI daily who is presenting today to schedule first-ever screening colonoscopy.  She has no significant upper or lower GI symptoms.  No alarm symptoms.  No family history of colon cancer.   Plan: Proceed with colonoscopy with propofol with Dr. 52 in the near future. The  risks, benefits, and alternatives have been discussed with the patient in detail. The patient states understanding and desires to proceed. ASA III Pregnancy test at preop Advised patient to reach out to her insurance company to ensure they cover screening colonoscopy before age 58. Follow-up as needed.   44, PA-C [facility] 03/11/2021

## 2021-03-11 ENCOUNTER — Other Ambulatory Visit: Payer: Self-pay

## 2021-03-11 ENCOUNTER — Ambulatory Visit (INDEPENDENT_AMBULATORY_CARE_PROVIDER_SITE_OTHER): Payer: Commercial Managed Care - PPO | Admitting: Gastroenterology

## 2021-03-11 ENCOUNTER — Encounter: Payer: Self-pay | Admitting: Gastroenterology

## 2021-03-11 VITALS — BP 135/82 | HR 69 | Temp 97.3°F | Ht 64.0 in | Wt 288.4 lb

## 2021-03-11 DIAGNOSIS — Z1211 Encounter for screening for malignant neoplasm of colon: Secondary | ICD-10-CM

## 2021-03-11 NOTE — Patient Instructions (Addendum)
We will arrange for you to have a colonoscopy in the near future with Dr. Marletta Lor.  Please reach out to your insurance company to ensure they will cover a screening colonoscopy at the age of 68.  We will plan to see you back in the office as needed.  Do not hesitate to call if he have any new GI concerns.  It was a pleasure meeting you today!  Ermalinda Memos, PA-C [facility]astroenter[facility]

## 2021-03-17 ENCOUNTER — Other Ambulatory Visit: Payer: Self-pay

## 2021-03-17 ENCOUNTER — Telehealth: Payer: Self-pay

## 2021-03-17 MED ORDER — CLENPIQ 10-3.5-12 MG-GM -GM/160ML PO SOLN: 1.0000 | Freq: Once | ORAL | 0 refills | Status: AC

## 2021-03-17 NOTE — Telephone Encounter (Signed)
Called pt, TCS w/Propofol ASA 3 w/Dr. Marletta Lor scheduled for 04/10/21 at 7:30am. Pt was given Clenpiq coupon at [facility]. Rx for prep sent to pharmacy. Orders entered. Needs pregnancy test at pre-op. PA for TCS submitted via [facility] website. Case ID# [id number].

## 2021-03-17 NOTE — Telephone Encounter (Signed)
Pre-op appt 04/08/21. Appt letter mailed with procedure instructions. 

## 2021-04-07 NOTE — Patient Instructions (Signed)
[name]  04/07/2021     @PREFPERIOPPHARMACY @   Your procedure is scheduled on  04/10/2021.   Report to 13/09/2020 at  [phone]  A.M.   Call this number if you have problems the morning of surgery:  [phone]   Remember:  Follow the diet and prep instructions given to you by the office.    Take these medicines the morning of surgery with A SIP OF WATER       buspar, celexa, levothyroxine, omeprazole.     Do not wear jewelry, make-up or nail polish.  Do not wear lotions, powders, or perfumes, or deodorant.  Do not shave 48 hours prior to surgery.  Men may shave face and neck.  Do not bring valuables to the hospital.  [facility] is not responsible for any belongings or valuables.  Contacts, dentures or bridgework may not be worn into surgery.  Leave your suitcase in the car.  After surgery it may be brought to your room.  For patients admitted to the hospital, discharge time will be determined by your treatment team.  Patients discharged the day of surgery will not be allowed to drive home and must have someone with them for 24 hours.    Special instructions:       DO NOT smoke tobacco or vape for 24 hours before your procedure.   Please read over the following fact sheets that you were given. Anesthesia Post-op Instructions and Care and Recovery After Surgery      Colonoscopy, Adult, Care After This sheet gives you information about how to care for yourself after your procedure. Your health care provider may also give you more specific instructions. If you have problems or questions, contact your health care provider. What can I expect after the procedure? After the procedure, it is common to have: A small amount of blood in your stool for 24 hours after the procedure. Some gas. Mild cramping or bloating of your abdomen. Follow these instructions at home: Eating and drinking  Drink enough fluid to keep your urine pale yellow. Follow instructions  from your health care provider about eating or drinking restrictions. Resume your normal diet as instructed by your health care provider. Avoid heavy or fried foods that are hard to digest. Activity Rest as told by your health care provider. Avoid sitting for a long time without moving. Get up to take short walks every 1-2 hours. This is important to improve blood flow and breathing. Ask for help if you feel weak or unsteady. Return to your normal activities as told by your health care provider. Ask your health care provider what activities are safe for you. Managing cramping and bloating  Try walking around when you have cramps or feel bloated. Apply heat to your abdomen as told by your health care provider. Use the heat source that your health care provider recommends, such as a moist heat pack or a heating pad. Place a towel between your skin and the heat source. Leave the heat on for 20-30 minutes. Remove the heat if your skin turns bright red. This is especially important if you are unable to feel pain, heat, or cold. You may have a greater risk of getting burned. General instructions If you were given a sedative during the procedure, it can affect you for several hours. Do not drive or operate machinery until your health care provider says that it is safe. For the first 24 hours after the  procedure: Do not sign important documents. Do not drink alcohol. Do your regular daily activities at a slower pace than normal. Eat soft foods that are easy to digest. Take over-the-counter and prescription medicines only as told by your health care provider. Keep all follow-up visits as told by your health care provider. This is important. Contact a health care provider if: You have blood in your stool 2-3 days after the procedure. Get help right away if you have: More than a small spotting of blood in your stool. Large blood clots in your stool. Swelling of your abdomen. Nausea or vomiting. A  fever. Increasing pain in your abdomen that is not relieved with medicine. Summary After the procedure, it is common to have a small amount of blood in your stool. You may also have mild cramping and bloating of your abdomen. If you were given a sedative during the procedure, it can affect you for several hours. Do not drive or operate machinery until your health care provider says that it is safe. Get help right away if you have a lot of blood in your stool, nausea or vomiting, a fever, or increased pain in your abdomen. This information is not intended to replace advice given to you by your health care provider. Make sure you discuss any questions you have with your health care provider. Document Revised: 05/18/2019 Document Reviewed: 12/18/2018 Elsevier Patient Education  2022 [employer] Monitored Anesthesia Care, Care After This sheet gives you information about how to care for yourself after your procedure. Your health care provider may also give you more specific instructions. If you have problems or questions, contact your health care provider. What can I expect after the procedure? After the procedure, it is common to have: Tiredness. Forgetfulness about what happened after the procedure. Impaired judgment for important decisions. Nausea or vomiting. Some difficulty with balance. Follow these instructions at home: For the time period you were told by your health care provider:   Rest as needed. Do not participate in activities where you could fall or become injured. Do not drive or use machinery. Do not drink alcohol. Do not take sleeping pills or medicines that cause drowsiness. Do not make important decisions or sign legal documents. Do not take care of children on your own. Eating and drinking Follow the diet that is recommended by your health care provider. Drink enough fluid to keep your urine pale yellow. If you vomit: Drink water, juice, or soup when you can drink  without vomiting. Make sure you have little or no nausea before eating solid foods. General instructions Have a responsible adult stay with you for the time you are told. It is important to have someone help care for you until you are awake and alert. Take over-the-counter and prescription medicines only as told by your health care provider. If you have sleep apnea, surgery and certain medicines can increase your risk for breathing problems. Follow instructions from your health care provider about wearing your sleep device: Anytime you are sleeping, including during daytime naps. While taking prescription pain medicines, sleeping medicines, or medicines that make you drowsy. Avoid smoking. Keep all follow-up visits as told by your health care provider. This is important. Contact a health care provider if: You keep feeling nauseous or you keep vomiting. You feel light-headed. You are still sleepy or having trouble with balance after 24 hours. You develop a rash. You have a fever. You have redness or swelling around the IV site. Get help right  away if: You have trouble breathing. You have new-onset confusion at home. Summary For several hours after your procedure, you may feel tired. You may also be forgetful and have poor judgment. Have a responsible adult stay with you for the time you are told. It is important to have someone help care for you until you are awake and alert. Rest as told. Do not drive or operate machinery. Do not drink alcohol or take sleeping pills. Get help right away if you have trouble breathing, or if you suddenly become confused. This information is not intended to replace advice given to you by your health care provider. Make sure you discuss any questions you have with your health care provider. Document Revised: 02/07/2020 Document Reviewed: 04/26/2019 Elsevier Patient Education  2022 [employer].

## 2021-04-08 ENCOUNTER — Encounter (HOSPITAL_COMMUNITY)
Admission: RE | Admit: 2021-04-08 | Discharge: 2021-04-08 | Disposition: A | Discharge status: Home / Self Care | Payer: Commercial Managed Care - PPO | Source: Ambulatory Visit | Attending: Internal Medicine | Admitting: Internal Medicine

## 2021-04-08 VITALS — BP 140/75 | HR 71 | Temp 98.7°F | Resp 18 | Ht 64.0 in | Wt 280.0 lb

## 2021-04-08 DIAGNOSIS — Z01812 Encounter for preprocedural laboratory examination: Secondary | ICD-10-CM | POA: Insufficient documentation

## 2021-04-08 DIAGNOSIS — Z01818 Encounter for other preprocedural examination: Secondary | ICD-10-CM

## 2021-04-08 LAB — PREGNANCY, URINE: Preg Test, Ur: NEGATIVE

## 2021-04-10 ENCOUNTER — Ambulatory Visit (HOSPITAL_COMMUNITY): Payer: Commercial Managed Care - PPO | Admitting: Certified Registered Nurse Anesthetist

## 2021-04-10 ENCOUNTER — Ambulatory Visit (HOSPITAL_COMMUNITY)
Admission: RE | Admit: 2021-04-10 | Discharge: 2021-04-10 | Disposition: A | Discharge status: Home / Self Care | Payer: Commercial Managed Care - PPO | Attending: Internal Medicine | Admitting: Internal Medicine

## 2021-04-10 ENCOUNTER — Encounter (HOSPITAL_COMMUNITY)
Admission: RE | Disposition: A | Discharge status: Home / Self Care | Payer: Self-pay | Source: Home / Self Care | Attending: Internal Medicine

## 2021-04-10 ENCOUNTER — Encounter (HOSPITAL_COMMUNITY): Payer: Self-pay

## 2021-04-10 DIAGNOSIS — K648 Other hemorrhoids: Secondary | ICD-10-CM | POA: Diagnosis not present

## 2021-04-10 DIAGNOSIS — K5730 Diverticulosis of large intestine without perforation or abscess without bleeding: Secondary | ICD-10-CM | POA: Diagnosis not present

## 2021-04-10 DIAGNOSIS — K644 Residual hemorrhoidal skin tags: Secondary | ICD-10-CM | POA: Insufficient documentation

## 2021-04-10 DIAGNOSIS — Z1211 Encounter for screening for malignant neoplasm of colon: Secondary | ICD-10-CM | POA: Diagnosis present

## 2021-04-10 HISTORY — PX: COLONOSCOPY WITH PROPOFOL: SHX5780

## 2021-04-10 SURGERY — COLONOSCOPY WITH PROPOFOL
Anesthesia: General

## 2021-04-10 MED ORDER — LACTATED RINGERS IV SOLN
INTRAVENOUS | Status: DC
  Administered 2021-04-10: 1000 mL via INTRAVENOUS

## 2021-04-10 MED ORDER — PROPOFOL 10 MG/ML IV BOLUS
INTRAVENOUS | Status: DC | PRN
  Administered 2021-04-10: 40 mg via INTRAVENOUS
  Administered 2021-04-10: 120 mg via INTRAVENOUS
  Administered 2021-04-10 (×2): 40 mg via INTRAVENOUS
  Administered 2021-04-10: 30 mg via INTRAVENOUS

## 2021-04-10 NOTE — Anesthesia Postprocedure Evaluation (Signed)
Anesthesia Post Note  Patient: [name]  Procedure(s) Performed: COLONOSCOPY WITH PROPOFOL  Patient location during evaluation: Phase II Anesthesia Type: General Level of consciousness: awake Pain management: pain level controlled Vital Signs Assessment: post-procedure vital signs reviewed and stable Respiratory status: spontaneous breathing and respiratory function stable Cardiovascular status: blood pressure returned to baseline and stable Postop Assessment: no headache and no apparent nausea or vomiting Anesthetic complications: no Comments: Late entry   No notable events documented.   Last Vitals:  Vitals:   04/10/21 0651 04/10/21 0800  BP: 133/83 (!) 108/59  Pulse:  69  Resp: 18 20  Temp: 36.7 C 36.7 C  SpO2: 95% 100%    Last Pain:  Vitals:   04/10/21 0800  TempSrc: Oral  PainSc: 0-No pain                 Windell Norfolk

## 2021-04-10 NOTE — Transfer of Care (Signed)
Immediate Anesthesia Transfer of Care Note  Patient: [name]  Procedure(s) Performed: COLONOSCOPY WITH PROPOFOL  Patient Location: Short Stay  Anesthesia Type:General  Level of Consciousness: awake, alert  and oriented  Airway & Oxygen Therapy: Patient Spontanous Breathing  Post-op Assessment: Report given to RN and Post -op Vital signs reviewed and stable  Post vital signs: Reviewed and stable  Last Vitals:  Vitals Value Taken Time  BP 108/59 04/10/21 0800  Temp 36.7 C 04/10/21 0800  Pulse 69 04/10/21 0800  Resp 20 04/10/21 0800  SpO2 100 % 04/10/21 0800    Last Pain:  Vitals:   04/10/21 0800  TempSrc: Oral  PainSc: 0-No pain      Patients Stated Pain Goal: 6 (04/10/21 0651)  Complications: No notable events documented.

## 2021-04-10 NOTE — Anesthesia Preprocedure Evaluation (Addendum)
Anesthesia Evaluation  Patient identified by MRN, date of birth, ID band Patient awake    Reviewed: Allergy & Precautions, H&P , NPO status , Patient's Chart, lab work & pertinent test results, reviewed documented beta blocker date and time   Airway Mallampati: II  TM Distance: >3 FB Neck ROM: full    Dental no notable dental hx.    Pulmonary neg pulmonary ROS,    Pulmonary exam normal breath sounds clear to auscultation       Cardiovascular Exercise Tolerance: Good negative cardio ROS   Rhythm:regular Rate:Normal     Neuro/Psych PSYCHIATRIC DISORDERS Anxiety Depression negative neurological ROS     GI/Hepatic negative GI ROS, Neg liver ROS,   Endo/Other  Hypothyroidism Morbid obesity  Renal/GU negative Renal ROS  negative genitourinary   Musculoskeletal   Abdominal   Peds  Hematology negative hematology ROS (+)   Anesthesia Other Findings   Reproductive/Obstetrics negative OB ROS                            Anesthesia Physical Anesthesia Plan  ASA: 3  Anesthesia Plan: General   Post-op Pain Management:    Induction:   PONV Risk Score and Plan: Propofol infusion  Airway Management Planned:   Additional Equipment:   Intra-op Plan:   Post-operative Plan:   Informed Consent: I have reviewed the patients History and Physical, chart, labs and discussed the procedure including the risks, benefits and alternatives for the proposed anesthesia with the patient or authorized representative who has indicated his/her understanding and acceptance.     Dental Advisory Given  Plan Discussed with: CRNA  Anesthesia Plan Comments:        Anesthesia Quick Evaluation

## 2021-04-10 NOTE — Interval H&P Note (Signed)
History and Physical Interval Note:  04/10/2021 7:29 AM  [name]  has presented today for surgery, with the diagnosis of colon cancer screening.  The various methods of treatment have been discussed with the patient and family. After consideration of risks, benefits and other options for treatment, the patient has consented to  Procedure(s) with comments: COLONOSCOPY WITH PROPOFOL (N/A) - 7:30am as a surgical intervention.  The patient's history has been reviewed, patient examined, no change in status, stable for surgery.  I have reviewed the patient's chart and labs.  Questions were answered to the patient's satisfaction.     Lanelle Bal

## 2021-04-10 NOTE — Op Note (Signed)
[facility] Patient Name: [name] Procedure Date: 04/10/2021 7:38 AM MRN: [id number] Date of Birth: 1971/04/22 Attending MD: Elon Alas. Edgar Frisk CSN: [id number] Age: 50 Admit Type: Outpatient Procedure:                Colonoscopy Indications:              Screening for colorectal malignant neoplasm Providers:                Elon Alas. Abbey Chatters, DO, Lambert Mody, Nelma Rothman, Technician Referring MD:              Medicines:                See the Anesthesia note for documentation of the                            administered medications Complications:            No immediate complications. Estimated Blood Loss:     Estimated blood loss: none. Procedure:                Pre-Anesthesia Assessment:                           - The anesthesia plan was to use monitored                            anesthesia care (MAC).                           After obtaining informed consent, the colonoscope                            was passed under direct vision. Throughout the                            procedure, the patient's blood pressure, pulse, and                            oxygen saturations were monitored continuously. The                            [id number]-HQ190L ([id number]) scope was introduced through                            the anus and advanced to the the cecum, identified                            by appendiceal orifice and ileocecal valve. The                            colonoscopy was performed without difficulty. The                            patient tolerated the procedure well. The quality  of the bowel preparation was evaluated using the                            BBPS [facility] Bowel Preparation Scale) with scores                            of: Right Colon = 2 (minor amount of residual                            staining, small fragments of stool and/or opaque                            liquid, but mucosa seen well),  Transverse Colon = 2                            (minor amount of residual staining, small fragments                            of stool and/or opaque liquid, but mucosa seen                            well) and Left Colon = 2 (minor amount of residual                            staining, small fragments of stool and/or opaque                            liquid, but mucosa seen well). The total BBPS score                            equals 6. The quality of the bowel preparation was                            fair. Scope In: 7:40:56 AM Scope Out: 7:54:21 AM Scope Withdrawal Time: 0 hours 11 minutes 32 seconds  Total Procedure Duration: 0 hours 13 minutes 25 seconds  Findings:      Skin tags were found on perianal exam.      Non-bleeding internal hemorrhoids were found during endoscopy.      Multiple small and large-mouthed diverticula were found in the sigmoid       colon and descending colon.      A moderate amount of stool was found in the entire colon, making       visualization difficult. Lavage of the area was performed using copious       amounts of sterile water, resulting in clearance with fair visualization. Impression:               - Preparation of the colon was fair.                           - Perianal skin tags found on perianal exam.                           - Non-bleeding  internal hemorrhoids.                           - Diverticulosis in the sigmoid colon and in the                            descending colon.                           - Stool in the entire examined colon.                           - No specimens collected. Moderate Sedation:      Per Anesthesia Care Recommendation:           - Patient has a contact number available for                            emergencies. The signs and symptoms of potential                            delayed complications were discussed with the                            patient. Return to normal activities tomorrow.                             Written discharge instructions were provided to the                            patient.                           - Resume previous diet.                           - Continue present medications.                           - Repeat colonoscopy in 10 years for screening                            purposes.                           - Return to GI clinic PRN. Procedure Code(s):        --- Professional ---                           Q3009, Colorectal cancer screening; colonoscopy on                            individual not meeting criteria for high risk Diagnosis Code(s):        --- Professional ---                           Z12.11, Encounter for screening for malignant  neoplasm of colon                           K64.8, Other hemorrhoids                           K64.4, Residual hemorrhoidal skin tags                           K57.30, Diverticulosis of large intestine without                            perforation or abscess without bleeding CPT copyright 2019 American Medical Association. All rights reserved. The codes documented in this report are preliminary and upon coder review may  be revised to meet current compliance requirements. Elon Alas. Abbey Chatters, DO [place] Abbey Chatters, DO 04/10/2021 7:59:59 AM This report has been signed electronically. Number of Addenda: 0

## 2021-04-10 NOTE — Discharge Instructions (Addendum)

## 2021-04-15 ENCOUNTER — Encounter (HOSPITAL_COMMUNITY): Payer: Self-pay | Admitting: Internal Medicine

## 2021-05-12 ENCOUNTER — Ambulatory Visit (INDEPENDENT_AMBULATORY_CARE_PROVIDER_SITE_OTHER): Payer: Commercial Managed Care - PPO | Admitting: Adult Health

## 2021-05-12 ENCOUNTER — Encounter: Payer: Self-pay | Admitting: Adult Health

## 2021-05-12 ENCOUNTER — Other Ambulatory Visit: Payer: Self-pay

## 2021-05-12 VITALS — BP 120/76 | HR 76 | Ht 64.0 in | Wt 278.0 lb

## 2021-05-12 DIAGNOSIS — F419 Anxiety disorder, unspecified: Secondary | ICD-10-CM | POA: Diagnosis not present

## 2021-05-12 DIAGNOSIS — F32A Depression, unspecified: Secondary | ICD-10-CM

## 2021-05-12 MED ORDER — HYDROXYZINE PAMOATE 25 MG PO CAPS: 25.0000 mg | ORAL_CAPSULE | Freq: Three times a day (TID) | ORAL | 3 refills | Status: DC | PRN

## 2021-05-12 MED ORDER — VENLAFAXINE HCL ER 37.5 MG PO CP24: ORAL_CAPSULE | ORAL | 2 refills | Status: DC

## 2021-05-12 NOTE — Progress Notes (Signed)
Subjective:     Patient ID: [name], female   DOB: September 26, 1970, 50 y.o.   MRN: [id number]  HPI [name] is a 50 year old white female,married, G2P2 in complaining of depression, is irritable and sad, easily teary.  Will wake up and be teary, and just not right, ?panic attack.  Is on saxenda since about June and lost about 25 lbs she says, she saw Barbara Rutherford NP for this.  Lab Results  Component Value Date   DIAGPAP  10/16/2020    - Negative for intraepithelial lesion or malignancy (NILM)   HPVHIGH Negative 10/16/2020   PCP is [name].   Review of Systems +depressed, sad See HPI for more positives. Denies any hot flashes  Denies any problems at home, or work, just working some overtime  Reviewed past medical,surgical, social and family history. Reviewed medications and allergies.     Objective:   Physical Exam BP 120/76 (BP Location: Right Arm, Patient Position: Sitting, Cuff Size: Large)   Pulse 76   Ht 5\' 4"  (1.626 m)   Wt 278 lb (126.1 kg)   BMI 47.72 kg/m     Skin warm and dry.  Lungs: clear to ausculation bilaterally. Cardiovascular: regular rate and rhythm.  Depression screen [facility] 2/9 05/12/2021 10/16/2020 06/05/2019  Decreased Interest 2 1 0  Down, Depressed, Hopeless 2 1 0  PHQ - 2 Score 4 2 0  Altered sleeping 2 1 -  Tired, decreased energy 2 1 -  Change in appetite 1 1 -  Feeling bad or failure about yourself  2 1 -  Trouble concentrating 1 2 -  Moving slowly or fidgety/restless 0 0 -  Suicidal thoughts 0 0 -  PHQ-9 Score 12 8 -    GAD 7 : Generalized Anxiety Score 05/12/2021 10/16/2020  Nervous, Anxious, on Edge 3 1  Control/stop worrying 2 1  Worry too much - different things 2 1  Trouble relaxing 2 1  Restless 1 1  Easily annoyed or irritable 1 1  Afraid - awful might happen 1 0  Total GAD 7 Score 12 6    Upstream - 05/12/21 1146       Pregnancy Intention Screening   Does the patient want to become pregnant in the next year? No    Does the  patient's partner want to become pregnant in the next year? No    Would the patient like to discuss contraceptive options today? No      Contraception Wrap Up   Current Method IUD or IUS    End Method IUD or IUS    Contraception Counseling Provided No               Assessment:     1. Anxiety and depression Continue Buspar has refills Will stop Celexa and try Effexor Will rx vistaril as needed, like when wakes up at night  Meds ordered this encounter  Medications   venlafaxine XR (EFFEXOR XR) 37.5 MG 24 hr capsule    Sig: Take 1 daily for 1 week then 2 daily    Dispense:  60 capsule    Refill:  2    Order Specific Question:   Supervising Provider    Answer:   14/06/22 H [[id number]]   hydrOXYzine (VISTARIL) 25 MG capsule    Sig: Take 1 capsule (25 mg total) by mouth every 8 (eight) hours as needed.    Dispense:  30 capsule    Refill:  3  Order Specific Question:   Supervising Provider    Answer:   [name] [[id number]]       Plan:     Follow up in 6 weeks

## 2021-06-04 ENCOUNTER — Other Ambulatory Visit: Payer: Self-pay | Admitting: Adult Health

## 2021-06-04 MED ORDER — HYDROXYZINE HCL 10 MG PO TABS: 10.0000 mg | ORAL_TABLET | Freq: Three times a day (TID) | ORAL | 3 refills | Status: DC | PRN

## 2021-06-04 NOTE — Progress Notes (Signed)
Will decrease vistaril dose to 10 mg

## 2021-06-23 ENCOUNTER — Ambulatory Visit (INDEPENDENT_AMBULATORY_CARE_PROVIDER_SITE_OTHER): Payer: Commercial Managed Care - PPO | Admitting: Adult Health

## 2021-06-23 ENCOUNTER — Other Ambulatory Visit: Payer: Self-pay

## 2021-06-23 ENCOUNTER — Encounter: Payer: Self-pay | Admitting: Adult Health

## 2021-06-23 VITALS — BP 118/81 | HR 76 | Ht 64.0 in | Wt 270.6 lb

## 2021-06-23 DIAGNOSIS — F32A Depression, unspecified: Secondary | ICD-10-CM | POA: Diagnosis not present

## 2021-06-23 DIAGNOSIS — F419 Anxiety disorder, unspecified: Secondary | ICD-10-CM | POA: Diagnosis not present

## 2021-06-23 MED ORDER — BUSPIRONE HCL 30 MG PO TABS: 30.0000 mg | ORAL_TABLET | Freq: Two times a day (BID) | ORAL | 3 refills | Status: DC

## 2021-06-23 NOTE — Progress Notes (Signed)
°  Subjective:     Patient ID: [name], female   DOB: 1970/06/21, 51 y.o.   MRN: [id number]  HPI [name] is a 51 year old white female,married, G2P2 back in follow up on starting Effexor and is better with depression and still anxious. Lab Results  Component Value Date   DIAGPAP  10/16/2020    - Negative for intraepithelial lesion or malignancy (NILM)   Hopkins Negative 10/16/2020   PCP is Sallee Lange MD   Review of Systems Depression better Still anxious Reviewed past medical,surgical, social and family history. Reviewed medications and allergies.     Objective:   Physical Exam BP 118/81 (BP Location: Right Arm, Patient Position: Sitting, Cuff Size: Normal)    Pulse 76    Ht 5\' 4"  (1.626 m)    Wt 270 lb 9.6 oz (122.7 kg)    BMI 46.45 kg/m     Skin warm and dry. Lungs: clear to ausculation bilaterally. Cardiovascular: regular rate and rhythm.  Depression screen [facility] 2/9 06/23/2021 05/12/2021 10/16/2020  Decreased Interest 1 2 1   Down, Depressed, Hopeless 1 2 1   PHQ - 2 Score 2 4 2   Altered sleeping 2 2 1   Tired, decreased energy 1 2 1   Change in appetite 1 1 1   Feeling bad or failure about yourself  1 2 1   Trouble concentrating 1 1 2   Moving slowly or fidgety/restless 0 0 0  Suicidal thoughts 0 0 0  PHQ-9 Score 8 12 8     GAD 7 : Generalized Anxiety Score 06/23/2021 05/12/2021 10/16/2020  Nervous, Anxious, on Edge 3 3 1   Control/stop worrying 3 2 1   Worry too much - different things 2 2 1   Trouble relaxing 2 2 1   Restless 1 1 1   Easily annoyed or irritable 1 1 1   Afraid - awful might happen 0 1 0  Total GAD 7 Score 12 12 6       Upstream - 06/23/21 1411       Pregnancy Intention Screening   Does the patient want to become pregnant in the next year? No    Does the patient's partner want to become pregnant in the next year? No    Would the patient like to discuss contraceptive options today? No      Contraception Wrap Up   Current Method IUD or IUS    End Method IUD  or IUS    Contraception Counseling Provided No             Assessment:     1. Anxiety and depression Continue Effexor 75 mg daily,has refill  And will increase Buspar to 30 mg bid Meds ordered this encounter  Medications   busPIRone (BUSPAR) 30 MG tablet    Sig: Take 1 tablet (30 mg total) by mouth 2 (two) times daily.    Dispense:  60 tablet    Refill:  3    Order Specific Question:   Supervising Provider    Answer:   Florian Buff [[id number]]       Plan:     Follow up in 4 weeks

## 2021-07-27 ENCOUNTER — Ambulatory Visit: Payer: Commercial Managed Care - PPO | Admitting: Adult Health

## 2021-08-11 ENCOUNTER — Other Ambulatory Visit: Payer: Self-pay | Admitting: Adult Health

## 2021-09-10 ENCOUNTER — Other Ambulatory Visit: Payer: Self-pay | Admitting: Adult Health

## 2021-09-10 MED ORDER — TRAZODONE HCL 50 MG PO TABS: 50.0000 mg | ORAL_TABLET | Freq: Every day | ORAL | 1 refills | Status: DC

## 2021-09-10 NOTE — Progress Notes (Signed)
Will rx trazodone 

## 2021-10-27 ENCOUNTER — Other Ambulatory Visit (HOSPITAL_COMMUNITY): Payer: Self-pay | Admitting: Adult Health Nurse Practitioner

## 2021-10-27 DIAGNOSIS — Z1231 Encounter for screening mammogram for malignant neoplasm of breast: Secondary | ICD-10-CM

## 2021-11-06 ENCOUNTER — Ambulatory Visit (HOSPITAL_COMMUNITY)
Admission: RE | Admit: 2021-11-06 | Discharge: 2021-11-06 | Disposition: A | Discharge status: Home / Self Care | Payer: Commercial Managed Care - PPO | Source: Ambulatory Visit | Attending: Adult Health Nurse Practitioner | Admitting: Adult Health Nurse Practitioner

## 2021-11-06 DIAGNOSIS — Z1231 Encounter for screening mammogram for malignant neoplasm of breast: Secondary | ICD-10-CM | POA: Diagnosis not present

## 2021-12-09 ENCOUNTER — Other Ambulatory Visit: Payer: Self-pay | Admitting: Adult Health

## 2022-08-06 IMAGING — MG MM DIGITAL SCREENING BILAT W/ TOMO AND CAD
8 series · 8 of 24 positions shown · non-contrast
Comparison: Previous exam(s).

CLINICAL DATA: Screening.

EXAM:
DIGITAL SCREENING BILATERAL MAMMOGRAM WITH TOMOSYNTHESIS AND CAD
TECHNIQUE: Bilateral screening digital craniocaudal and mediolateral oblique
mammograms were obtained. Bilateral screening digital breast
tomosynthesis was performed. The images were evaluated with
computer-aided detection.

[R MLO synth-2D]
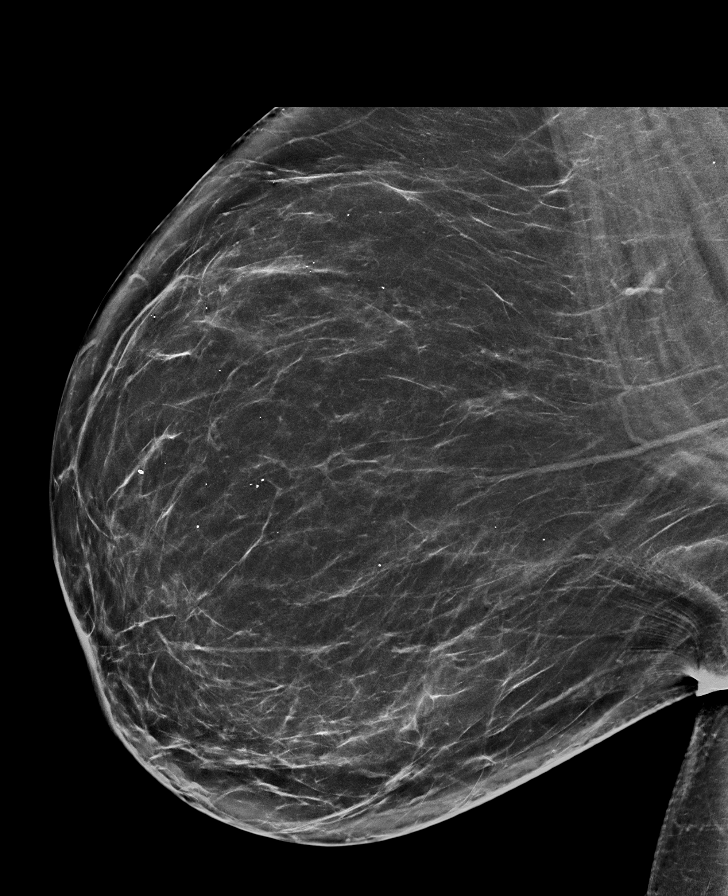

[L MLO synth-2D]
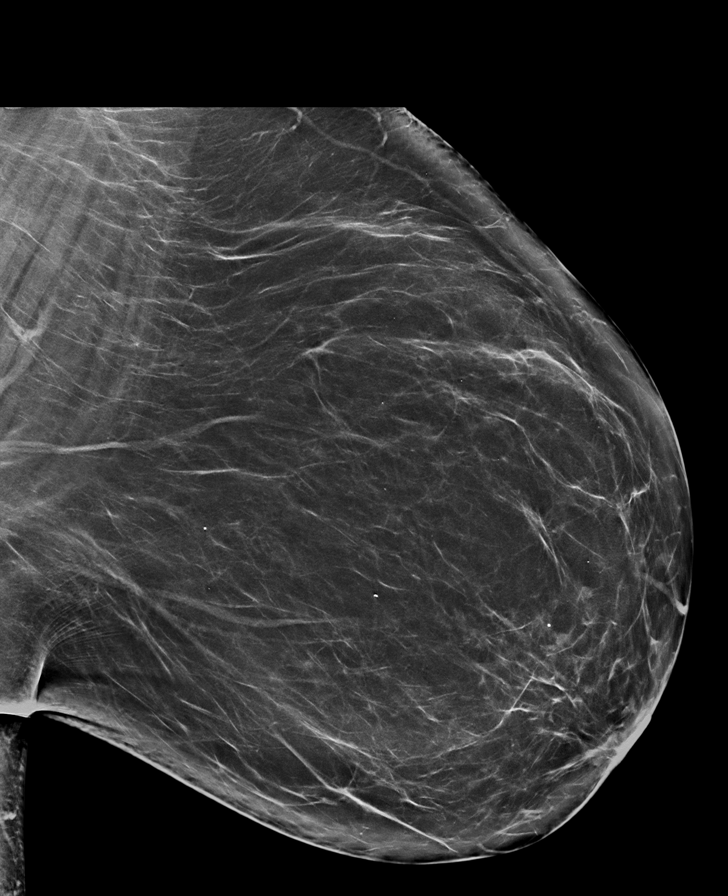

[R CC synth-2D]
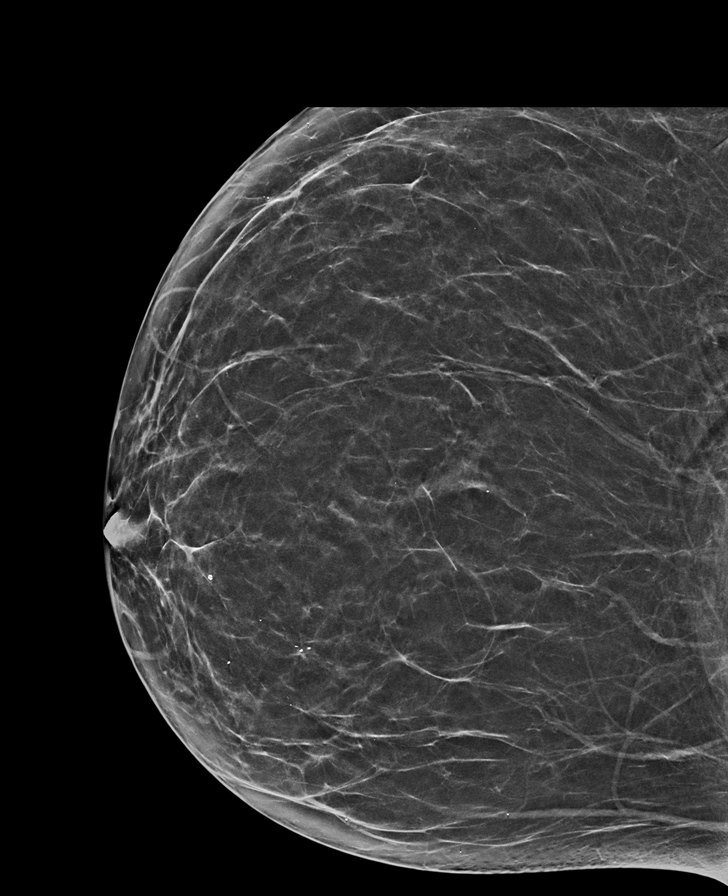

[L CC synth-2D]
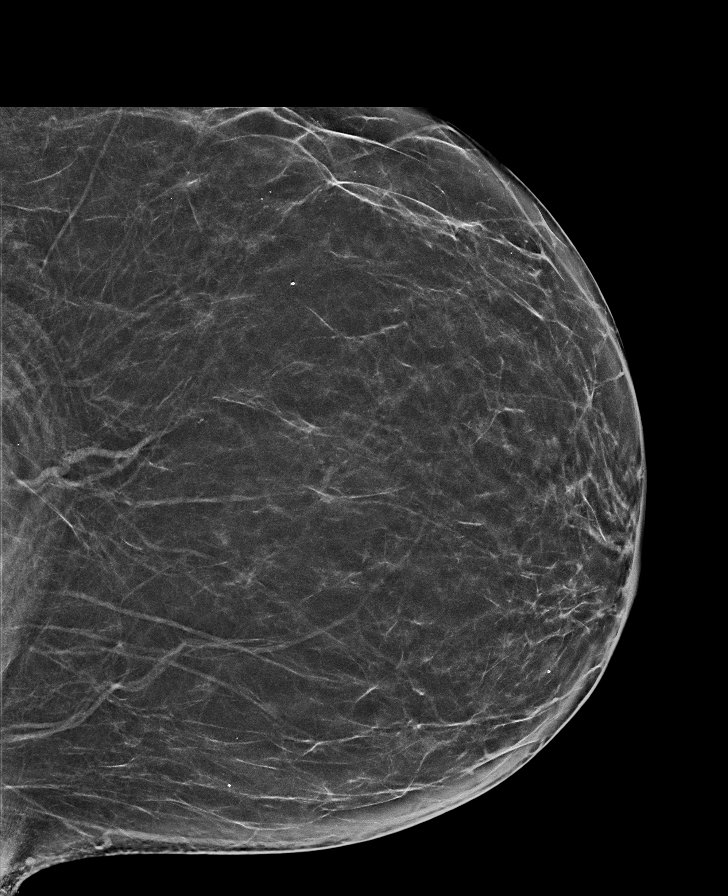

[R MLO tomo · tomo slice 43/85.0]
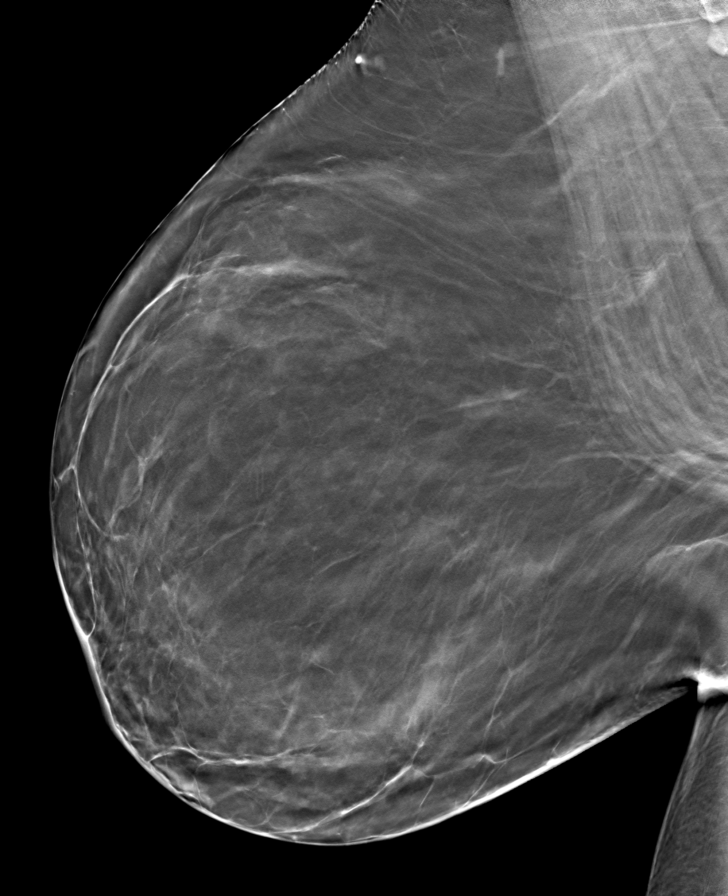

[R CC tomo · tomo slice 37/72.0]
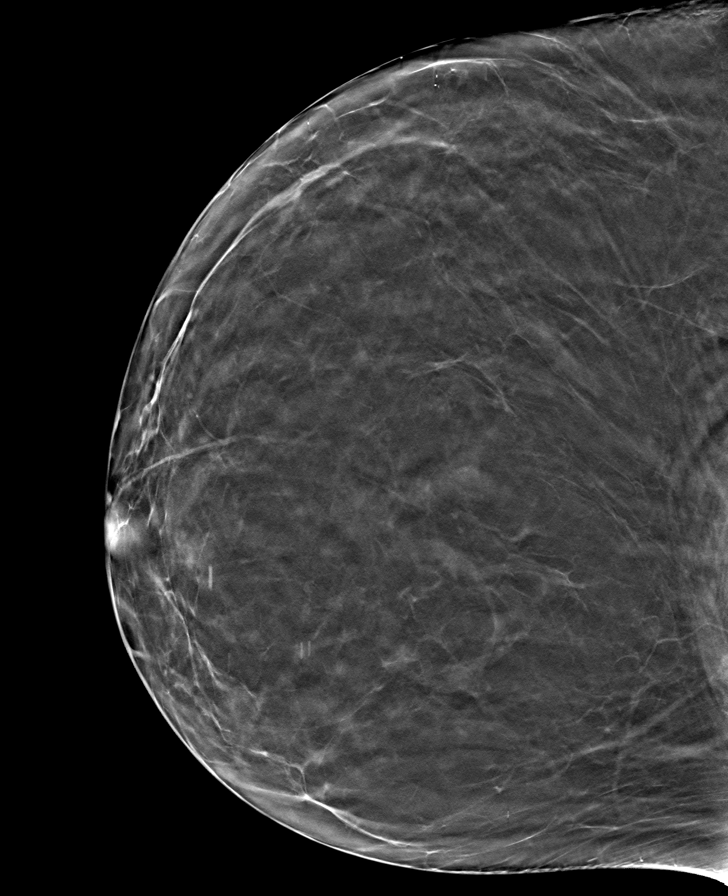

[L MLO tomo · tomo slice 45/89.0]
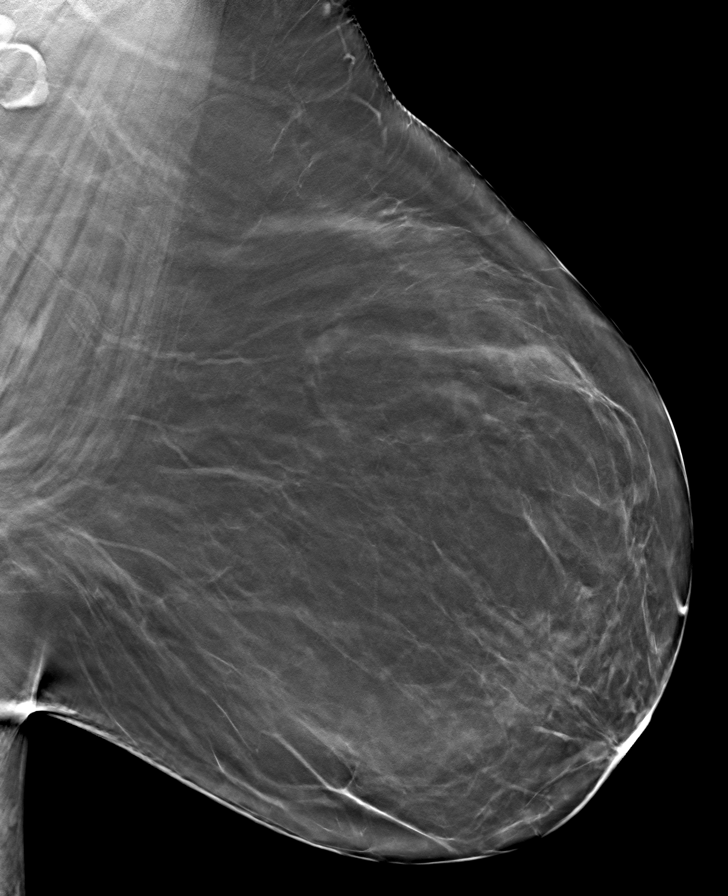

[L CC tomo · tomo slice 37/72.0]
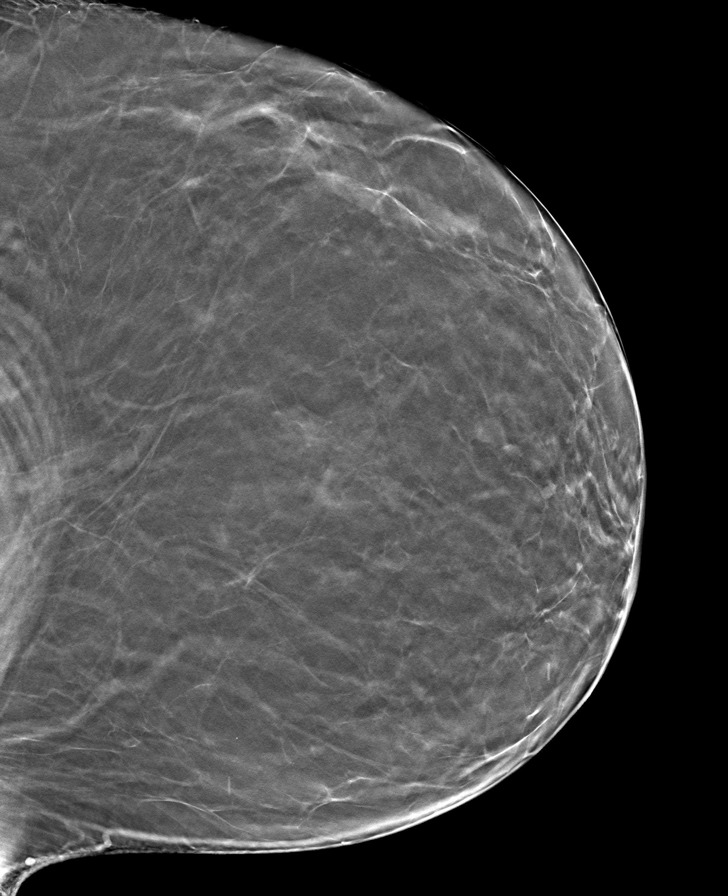

[8 of 24 positions shown; findings below may reference images not displayed]

ACR Breast Density Category b: There are scattered areas of
fibroglandular density.
FINDINGS: There are no findings suspicious for malignancy.
IMPRESSION: No mammographic evidence of malignancy. A result letter of this
screening mammogram will be mailed directly to the patient.

RECOMMENDATION:
Screening mammogram in one year. (Code:51-O-LD2)

BI-RADS CATEGORY  1: Negative.

## 2022-12-30 ENCOUNTER — Other Ambulatory Visit: Payer: Self-pay | Admitting: Adult Health

## 2023-02-01 ENCOUNTER — Ambulatory Visit (INDEPENDENT_AMBULATORY_CARE_PROVIDER_SITE_OTHER): Payer: Commercial Managed Care - PPO | Admitting: Plastic Surgery

## 2023-02-01 ENCOUNTER — Encounter: Payer: Self-pay | Admitting: Plastic Surgery

## 2023-02-01 VITALS — BP 124/83 | HR 75 | Ht 64.0 in | Wt 195.4 lb

## 2023-02-01 DIAGNOSIS — Z6833 Body mass index (BMI) 33.0-33.9, adult: Secondary | ICD-10-CM | POA: Diagnosis not present

## 2023-02-01 DIAGNOSIS — N62 Hypertrophy of breast: Secondary | ICD-10-CM | POA: Insufficient documentation

## 2023-02-01 DIAGNOSIS — G8929 Other chronic pain: Secondary | ICD-10-CM

## 2023-02-01 DIAGNOSIS — M542 Cervicalgia: Secondary | ICD-10-CM | POA: Insufficient documentation

## 2023-02-01 DIAGNOSIS — M546 Pain in thoracic spine: Secondary | ICD-10-CM | POA: Diagnosis not present

## 2023-02-01 DIAGNOSIS — M549 Dorsalgia, unspecified: Secondary | ICD-10-CM | POA: Insufficient documentation

## 2023-02-01 NOTE — Progress Notes (Signed)
Patient ID: [name], female    DOB: Dec 29, 1970, 52 y.o.   MRN: [id number]   Chief Complaint  Patient presents with   Advice Only   Breast Problem    Mammary Hyperplasia: The patient is a 52 y.o. female with a history of mammary hyperplasia for several years.  She has extremely large breasts causing symptoms that include the following: Back pain in the upper and lower back, including neck pain. She pulls or pins her bra straps to provide better lift and relief of the pressure and pain. She notices relief by holding her breast up manually.  Her shoulder straps cause grooves and pain and pressure that requires padding for relief. Pain medication is sometimes required with motrin and tylenol.  Activities that are hindered by enlarged breasts include: exercise and running.  She has tried supportive clothing as well as fitted bras without improvement.  Her breasts are extremely large and fairly symmetric.  She has hyperpigmentation of the inframammary area on both sides.  The sternal to nipple distance on the right is 36 cm and the left is 34 cm.  The IMF distance is 20 cm.  She is 5 feet 4 inches tall and weighs 195 pounds.  The BMI = 33.5 kg/m.  Preoperative bra size = F cup.  She would like to be a C/D cup. The estimated excess breast tissue to be removed at the time of surgery = 600 grams on the left and 600 grams on the right.  Mammogram history:01/21/23 negative.  Family history of breast cancer:  no.  Tobacco use:  no.   The patient expresses the desire to pursue surgical intervention. The patient underwent a cholecystectomy, gastric sleeve, knee surgery and wrist surgery.  She has lost over 100 pounds in the past several years.  Her past medical history is positive for thyroid disease, anxiety, and depression.     Review of Systems  Constitutional: Negative.   HENT: Negative.    Eyes: Negative.   Respiratory: Negative.    Cardiovascular: Negative.   Gastrointestinal: Negative.    Endocrine: Negative.   Genitourinary: Negative.   Musculoskeletal:  Positive for back pain and neck pain.  Skin:  Positive for rash.  Neurological: Negative.     Past Medical History:  Diagnosis Date   Anxiety    Hematuria    Hypothyroidism    IUD (intrauterine device) in place 12/06/2012   mirena inserted 2014   Obesity    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   Weight gain 12/11/2015    Past Surgical History:  Procedure Laterality Date   CHOLECYSTECTOMY     COLONOSCOPY WITH PROPOFOL N/A 04/10/2021   Procedure: COLONOSCOPY WITH PROPOFOL;  Surgeon: Lanelle Bal, DO;  [facility]: [facility];  Service: Endoscopy;  Laterality: N/A;  7:30am   KNEE SURGERY     LAPAROSCOPIC GASTRIC SLEEVE RESECTION     WRIST SURGERY     x2      Current Outpatient Medications:    buPROPion (WELLBUTRIN XL) 300 MG 24 hr tablet, Take 300 mg by mouth every morning., Disp: , Rfl:    citalopram (CELEXA) 40 MG tablet, TAKE ONE TABLET BY MOUTH ONCE DAILY., Disp: 90 tablet, Rfl: 0   clonazePAM (KLONOPIN) 0.5 MG tablet, Take by mouth., Disp: , Rfl:    cyanocobalamin (VITAMIN B12) 1000 MCG/ML injection, Inject 1,000 mcg into the skin every 30 (thirty) days., Disp: , Rfl:    DENTA 5000 PLUS  1.1 % CREA dental cream, Take by mouth at bedtime., Disp: , Rfl:    levonorgestrel (MIRENA) 20 MCG/24HR IUD, 1 Intra Uterine Device (1 each total) by Intrauterine route once., Disp: 1 each, Rfl: 0   levothyroxine (SYNTHROID) 100 MCG tablet, Take 100 mcg by mouth daily., Disp: , Rfl:    lisdexamfetamine (VYVANSE) 20 MG capsule, Take 20 mg by mouth every morning., Disp: , Rfl:    omeprazole (PRILOSEC) 20 MG capsule, Take 20 mg by mouth daily., Disp: , Rfl:    Vitamin D, Ergocalciferol, 50000 units CAPS, Take 1 capsule by mouth once a week., Disp: , Rfl:    WEGOVY 2.4 MG/0.75ML SOAJ, Inject into the skin., Disp: , Rfl:    Objective:   Vitals:   02/01/23 0944  BP: 124/83  Pulse: 75  SpO2: 96%     Physical Exam Vitals reviewed.  Constitutional:      Appearance: Normal appearance.  HENT:     Head: Atraumatic.  Cardiovascular:     Rate and Rhythm: Normal rate.     Pulses: Normal pulses.  Abdominal:     General: There is no distension.     Palpations: Abdomen is soft.  Musculoskeletal:        General: No swelling or deformity.  Skin:    General: Skin is warm.     Capillary Refill: Capillary refill takes less than 2 seconds.  Neurological:     Mental Status: She is alert and oriented to person, place, and time.  Psychiatric:        Mood and Affect: Mood normal.        Behavior: Behavior normal.        Thought Content: Thought content normal.        Judgment: Judgment normal.     Assessment & Plan:  Chronic bilateral thoracic back pain  Neck pain  Symptomatic mammary hypertrophy  The procedure the patient selected and that was best for the patient was discussed. The risk were discussed and include but not limited to the following:  Breast asymmetry, fluid accumulation, firmness of the breast, inability to breast feed, loss of nipple or areola, skin loss, change in skin and nipple sensation, fat necrosis of the breast tissue, bleeding, infection and healing delay.  There are risks of anesthesia and injury to nerves or blood vessels.  Allergic reaction to tape, suture and skin glue are possible.  There will be swelling.  Any of these can lead to the need for revisional surgery which is not included in this surgery.  A breast reduction has potential to interfere with diagnostic procedures in the future.  This procedure is best done when the breast is fully developed.  Changes in the breast will continue to occur over time: pregnancy, weight gain or weigh loss. No guarantees are given for a certain bra or breast size.    Total time: 40 minutes. This includes time spent with the patient during the visit as well as time spent before and after the visit reviewing the chart,  documenting the encounter, ordering pertinent studies and literature for the patient.    Patient's already had her mammogram which was negative.  She is a good candidate for bilateral breast reduction with liposuction.  I will go ahead and get this submitted and we will see if we can get it preauthorized.  Patient understands the risks and complications.  Pictures were obtained of the patient and placed in the chart with the patient's or guardian's permission.  Alena Bills Davelyn Gwinn, DO

## 2023-02-10 ENCOUNTER — Encounter: Payer: Self-pay | Admitting: Plastic Surgery

## 2023-02-14 ENCOUNTER — Telehealth: Payer: Self-pay | Admitting: Plastic Surgery

## 2023-02-14 NOTE — Telephone Encounter (Signed)
Patient called to check the status of her insurance authorization.  She said that she spoke with [facility] and they told her they have not received anything. She can be reached at [phone]

## 2023-03-23 ENCOUNTER — Ambulatory Visit: Payer: Commercial Managed Care - PPO | Admitting: Physician Assistant

## 2023-03-23 VITALS — BP 116/76 | HR 80

## 2023-03-23 DIAGNOSIS — N62 Hypertrophy of breast: Secondary | ICD-10-CM

## 2023-03-23 MED ORDER — OXYCODONE HCL 5 MG PO TABS: 5.0000 mg | ORAL_TABLET | Freq: Four times a day (QID) | ORAL | 0 refills | Status: AC | PRN

## 2023-03-23 MED ORDER — ONDANSETRON 4 MG PO TBDP: 4.0000 mg | ORAL_TABLET | Freq: Three times a day (TID) | ORAL | 0 refills | Status: DC | PRN

## 2023-03-23 MED ORDER — CLINDAMYCIN HCL 150 MG PO CAPS: 450.0000 mg | ORAL_CAPSULE | Freq: Three times a day (TID) | ORAL | 0 refills | Status: AC

## 2023-03-23 NOTE — H&P (View-Only) (Signed)
Patient ID: [name], female    DOB: 1971/04/28, 52 y.o.   MRN: [id number]  Chief Complaint  Patient presents with   Pre-op Exam      ICD-10-CM   1. Symptomatic mammary hypertrophy  N62        History of Present Illness: [name] is a 52 y.o.  female  with a history of macromastia.  She presents for preoperative evaluation for upcoming procedure, bilateral breast reduction with possible liposuction, scheduled for 04/13/2023 with Dr. Ulice Bold.  The patient reports that she had PONV when she was younger, but has not had any issues with postoperative nausea with her most recent surgeries.  She is on Wegovy and understands to hold it at least 1 week prior to surgery.  She will hold her vitamins 5 days prior to surgery and restart the day after.  She confirms that she is an F cup and like to be a D cup postoperatively.  She confirms IUD with Mirena, but no oral hormone replacement.  She denies any personal or family history of blood clots or clotting disorder.  Denies any personal history of cancer, cardiac or pulmonary disease, or nicotine use.  Patient has 4 children who will be able to assist with her postoperative recovery.  Summary of Previous Visit: She was last seen here in clinic on 02/01/2023 for initial consult.  At that time, complained of chronic upper back and neck discomfort in the context of large breasts.  Preoperative bra size equals F cup.  BMI was 33.5 kg/m.  STN 36 cm on the right and 34 cm on the left.  She expressed that she would like to be a C or D cup postoperatively.  Estimated excess breast tissue to be removed at time of surgery equals 600 g each side.  Mammogram 01/2023 negative.  Discussed breast reduction surgery and patient was agreeable to proceed.  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  Discussed 2 weeks FMLA.  PMH Significant for: Macromastia, thyroid disorder, anxiety, h/o bariatric surgery and  cholecystectomy.   Past Medical History: Allergies: Allergies  Allergen Reactions   Amoxicillin Other (See Comments)    [name] syndrome   Actifed Cold-Allergy [Chlorpheniramine-Phenyleph Er]     hives   Penicillins     Amoxicillin-Steven Johnson Syndrome    Current Medications:  Current Outpatient Medications:    buPROPion (WELLBUTRIN XL) 300 MG 24 hr tablet, Take 300 mg by mouth every morning., Disp: , Rfl:    citalopram (CELEXA) 40 MG tablet, TAKE ONE TABLET BY MOUTH ONCE DAILY., Disp: 90 tablet, Rfl: 0   clindamycin (CLEOCIN) 150 MG capsule, Take 3 capsules (450 mg total) by mouth 3 (three) times daily for 5 days., Disp: 45 capsule, Rfl: 0   clonazePAM (KLONOPIN) 0.5 MG tablet, Take by mouth., Disp: , Rfl:    cyanocobalamin (VITAMIN B12) 1000 MCG/ML injection, Inject 1,000 mcg into the skin every 30 (thirty) days., Disp: , Rfl:    DENTA 5000 PLUS 1.1 % CREA dental cream, Take by mouth at bedtime., Disp: , Rfl:    levonorgestrel (MIRENA) 20 MCG/24HR IUD, 1 Intra Uterine Device (1 each total) by Intrauterine route once., Disp: 1 each, Rfl: 0   levothyroxine (SYNTHROID) 100 MCG tablet, Take 100 mcg by mouth daily., Disp: , Rfl:    lisdexamfetamine (VYVANSE) 20 MG capsule, Take 20 mg by mouth every morning., Disp: , Rfl:    omeprazole (PRILOSEC) 20 MG capsule, Take  20 mg by mouth daily., Disp: , Rfl:    ondansetron (ZOFRAN-ODT) 4 MG disintegrating tablet, Take 1 tablet (4 mg total) by mouth every 8 (eight) hours as needed for nausea or vomiting., Disp: 20 tablet, Rfl: 0   oxyCODONE (ROXICODONE) 5 MG immediate release tablet, Take 1 tablet (5 mg total) by mouth every 6 (six) hours as needed for up to 5 days for severe pain (pain score 7-10)., Disp: 20 tablet, Rfl: 0   Vitamin D, Ergocalciferol, 50000 units CAPS, Take 1 capsule by mouth once a week., Disp: , Rfl:    WEGOVY 2.4 MG/0.75ML SOAJ, Inject into the skin., Disp: , Rfl:   Past Medical Problems: Past Medical History:   Diagnosis Date   Anxiety    Hematuria    Hypothyroidism    IUD (intrauterine device) in place 12/06/2012   mirena inserted 2014   Obesity    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   Weight gain 12/11/2015    Past Surgical History: Past Surgical History:  Procedure Laterality Date   CHOLECYSTECTOMY     COLONOSCOPY WITH PROPOFOL N/A 04/10/2021   Procedure: COLONOSCOPY WITH PROPOFOL;  Surgeon: Lanelle Bal, DO;  Location: [facility];  Service: Endoscopy;  Laterality: N/A;  7:30am   KNEE SURGERY     LAPAROSCOPIC GASTRIC SLEEVE RESECTION     WRIST SURGERY     x2    Social History: Social History   Socioeconomic History   Marital status: Married    Spouse name: Not on file   Number of children: 4   Years of education: Not on file   Highest education level: Not on file  Occupational History   Not on file  Tobacco Use   Smoking status: Never   Smokeless tobacco: Never  Vaping Use   Vaping status: Never Used  Substance and Sexual Activity   Alcohol use: Yes    Comment: approx 1 drink/month   Drug use: No   Sexual activity: Yes    Birth control/protection: I.U.D.  Other Topics Concern   Not on file  Social History Narrative   Not on file   Social Determinants of Health   Financial Resource Strain: Low Risk  (11/03/2022)   Received from [facility]   Overall Financial Resource Strain (CARDIA)    Difficulty of Paying Living Expenses: Not hard at all  Food Insecurity: No Food Insecurity (11/03/2022)   Received from [facility]   Hunger Vital Sign    Worried About Running Out of Food in the Last Year: Never true    Ran Out of Food in the Last Year: Never true  Transportation Needs: No Transportation Needs (11/03/2022)   Received from [facility] - Transportation    Lack of Transportation (Medical): No    Lack of Transportation (Non-Medical): No  Physical Activity: Insufficiently Active (11/03/2022)   Received from [facility]    Exercise Vital Sign    Days of Exercise per Week: 3 days    Minutes of Exercise per Session: 20 min  Stress: No Stress Concern Present (11/03/2022)   Received from [facility] - Occupational Stress Questionnaire    Feeling of Stress : Only a little  Social Connections: Socially Integrated (11/03/2022)   Received from [facility]   Social Network    How would you rate your social network (family, work, friends)?: Good participation with social networks  Intimate Partner Violence: Not At  Risk (11/03/2022)   Received from [facility]   HITS    Over the last 12 months how often did your partner physically hurt you?: 1    Over the last 12 months how often did your partner insult you or talk down to you?: 1    Over the last 12 months how often did your partner threaten you with physical harm?: 1    Over the last 12 months how often did your partner scream or curse at you?: 1    Family History: Family History  Problem Relation Age of Onset   Hypothyroidism Mother    Hypertension Father    Cancer Father        skin, lymph nodes   Alzheimer's disease Maternal Grandmother    Diabetes Paternal Grandmother    Heart disease Paternal Grandmother        CAD   Asthma Son    Allergies Son    Eczema Son    Colon cancer Neg Hx     Review of Systems: ROS Denies any chest pain, difficulty breathing, leg swelling, fevers.  Physical Exam: Vital Signs BP 116/76 (BP Location: Left Arm, Patient Position: Sitting, Cuff Size: Small)   Pulse 80   SpO2 100%   Physical Exam Constitutional:      General: Not in acute distress.    Appearance: Normal appearance. Not ill-appearing.  HENT:     Head: Normocephalic and atraumatic.  Eyes:     Pupils: Pupils are equal, round. Cardiovascular:     Rate and Rhythm: Normal rate.    Pulses: Normal pulses.  Pulmonary:     Effort: No respiratory distress or increased work of breathing.  Speaks in full  sentences. Abdominal:     General: Abdomen is flat. No distension.   Musculoskeletal: Normal range of motion. No lower extremity swelling or edema. No varicosities. Skin:    General: Skin is warm and dry.     Findings: No erythema or rash.  Neurological:     Mental Status: Alert and oriented to person, place, and time.  Psychiatric:        Mood and Affect: Mood normal.        Behavior: Behavior normal.    Assessment/Plan: The patient is scheduled for bilateral breast reduction with possible liposuction with Dr. Ulice Bold.  Risks, benefits, and alternatives of procedure discussed, questions answered and consent obtained.    Smoking Status: Non-smoker. Last Mammogram: 01/2023; Results: No significant change from previous exam.  No suspicious masses or calcifications.  Caprini Score: 4; Risk Factors include: Age, BMI greater than 25, and length of planned surgery. Recommendation for mechanical prophylaxis. Encourage early ambulation.   Pictures obtained: 02/01/2023  Post-op Rx sent to pharmacy: Clindamycin, oxycodone, and Zofran.  PCN allergy.  She has taken clindamycin in the past with good effect.  Patient was provided with the General Surgical Risk consent document and Pain Medication Agreement prior to their appointment.  They had adequate time to read through the risk consent documents and Pain Medication Agreement. We also discussed them in person together during this preop appointment. All of their questions were answered to their satisfaction.  Recommended calling if they have any further questions.  Risk consent form and Pain Medication Agreement to be scanned into patient's chart.  The risk that can be encountered with breast reduction were discussed and include the following but not limited to these:  Breast asymmetry, fluid accumulation, firmness of the breast, inability to breast feed, loss of  nipple or areola, skin loss, decrease or no nipple sensation, fat necrosis of the  breast tissue, bleeding, infection, healing delay.  There are risks of anesthesia, changes to skin sensation and injury to nerves or blood vessels.  The muscle can be temporarily or permanently injured.  You may have an allergic reaction to tape, suture, glue, blood products which can result in skin discoloration, swelling, pain, skin lesions, poor healing.  Any of these can lead to the need for revisonal surgery or stage procedures.  A reduction has potential to interfere with diagnostic procedures.  Nipple or breast piercing can increase risks of infection.  This procedure is best done when the breast is fully developed.  Changes in the breast will continue to occur over time.  Pregnancy can alter the outcomes of previous breast reduction surgery, weight gain and weigh loss can also effect the long term appearance.     Electronically signed by: Evelena Leyden, PA-C 03/23/2023 3:33 PM

## 2023-03-23 NOTE — Progress Notes (Signed)
Patient ID: [name], female    DOB: 1971/04/28, 52 y.o.   MRN: [id number]  Chief Complaint  Patient presents with   Pre-op Exam      ICD-10-CM   1. Symptomatic mammary hypertrophy  N62        History of Present Illness: [name] is a 52 y.o.  female  with a history of macromastia.  She presents for preoperative evaluation for upcoming procedure, bilateral breast reduction with possible liposuction, scheduled for 04/13/2023 with Dr. Ulice Bold.  The patient reports that she had PONV when she was younger, but has not had any issues with postoperative nausea with her most recent surgeries.  She is on Wegovy and understands to hold it at least 1 week prior to surgery.  She will hold her vitamins 5 days prior to surgery and restart the day after.  She confirms that she is an F cup and like to be a D cup postoperatively.  She confirms IUD with Mirena, but no oral hormone replacement.  She denies any personal or family history of blood clots or clotting disorder.  Denies any personal history of cancer, cardiac or pulmonary disease, or nicotine use.  Patient has 4 children who will be able to assist with her postoperative recovery.  Summary of Previous Visit: She was last seen here in clinic on 02/01/2023 for initial consult.  At that time, complained of chronic upper back and neck discomfort in the context of large breasts.  Preoperative bra size equals F cup.  BMI was 33.5 kg/m.  STN 36 cm on the right and 34 cm on the left.  She expressed that she would like to be a C or D cup postoperatively.  Estimated excess breast tissue to be removed at time of surgery equals 600 g each side.  Mammogram 01/2023 negative.  Discussed breast reduction surgery and patient was agreeable to proceed.  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  Discussed 2 weeks FMLA.  PMH Significant for: Macromastia, thyroid disorder, anxiety, h/o bariatric surgery and  cholecystectomy.   Past Medical History: Allergies: Allergies  Allergen Reactions   Amoxicillin Other (See Comments)    [name] syndrome   Actifed Cold-Allergy [Chlorpheniramine-Phenyleph Er]     hives   Penicillins     Amoxicillin-Steven Johnson Syndrome    Current Medications:  Current Outpatient Medications:    buPROPion (WELLBUTRIN XL) 300 MG 24 hr tablet, Take 300 mg by mouth every morning., Disp: , Rfl:    citalopram (CELEXA) 40 MG tablet, TAKE ONE TABLET BY MOUTH ONCE DAILY., Disp: 90 tablet, Rfl: 0   clindamycin (CLEOCIN) 150 MG capsule, Take 3 capsules (450 mg total) by mouth 3 (three) times daily for 5 days., Disp: 45 capsule, Rfl: 0   clonazePAM (KLONOPIN) 0.5 MG tablet, Take by mouth., Disp: , Rfl:    cyanocobalamin (VITAMIN B12) 1000 MCG/ML injection, Inject 1,000 mcg into the skin every 30 (thirty) days., Disp: , Rfl:    DENTA 5000 PLUS 1.1 % CREA dental cream, Take by mouth at bedtime., Disp: , Rfl:    levonorgestrel (MIRENA) 20 MCG/24HR IUD, 1 Intra Uterine Device (1 each total) by Intrauterine route once., Disp: 1 each, Rfl: 0   levothyroxine (SYNTHROID) 100 MCG tablet, Take 100 mcg by mouth daily., Disp: , Rfl:    lisdexamfetamine (VYVANSE) 20 MG capsule, Take 20 mg by mouth every morning., Disp: , Rfl:    omeprazole (PRILOSEC) 20 MG capsule, Take  20 mg by mouth daily., Disp: , Rfl:    ondansetron (ZOFRAN-ODT) 4 MG disintegrating tablet, Take 1 tablet (4 mg total) by mouth every 8 (eight) hours as needed for nausea or vomiting., Disp: 20 tablet, Rfl: 0   oxyCODONE (ROXICODONE) 5 MG immediate release tablet, Take 1 tablet (5 mg total) by mouth every 6 (six) hours as needed for up to 5 days for severe pain (pain score 7-10)., Disp: 20 tablet, Rfl: 0   Vitamin D, Ergocalciferol, 50000 units CAPS, Take 1 capsule by mouth once a week., Disp: , Rfl:    WEGOVY 2.4 MG/0.75ML SOAJ, Inject into the skin., Disp: , Rfl:   Past Medical Problems: Past Medical History:   Diagnosis Date   Anxiety    Hematuria    Hypothyroidism    IUD (intrauterine device) in place 12/06/2012   mirena inserted 2014   Obesity    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   Weight gain 12/11/2015    Past Surgical History: Past Surgical History:  Procedure Laterality Date   CHOLECYSTECTOMY     COLONOSCOPY WITH PROPOFOL N/A 04/10/2021   Procedure: COLONOSCOPY WITH PROPOFOL;  Surgeon: Lanelle Bal, DO;  Location: [facility];  Service: Endoscopy;  Laterality: N/A;  7:30am   KNEE SURGERY     LAPAROSCOPIC GASTRIC SLEEVE RESECTION     WRIST SURGERY     x2    Social History: Social History   Socioeconomic History   Marital status: Married    Spouse name: Not on file   Number of children: 4   Years of education: Not on file   Highest education level: Not on file  Occupational History   Not on file  Tobacco Use   Smoking status: Never   Smokeless tobacco: Never  Vaping Use   Vaping status: Never Used  Substance and Sexual Activity   Alcohol use: Yes    Comment: approx 1 drink/month   Drug use: No   Sexual activity: Yes    Birth control/protection: I.U.D.  Other Topics Concern   Not on file  Social History Narrative   Not on file   Social Determinants of Health   Financial Resource Strain: Low Risk  (11/03/2022)   Received from [facility]   Overall Financial Resource Strain (CARDIA)    Difficulty of Paying Living Expenses: Not hard at all  Food Insecurity: No Food Insecurity (11/03/2022)   Received from [facility]   Hunger Vital Sign    Worried About Running Out of Food in the Last Year: Never true    Ran Out of Food in the Last Year: Never true  Transportation Needs: No Transportation Needs (11/03/2022)   Received from [facility] - Transportation    Lack of Transportation (Medical): No    Lack of Transportation (Non-Medical): No  Physical Activity: Insufficiently Active (11/03/2022)   Received from [facility]    Exercise Vital Sign    Days of Exercise per Week: 3 days    Minutes of Exercise per Session: 20 min  Stress: No Stress Concern Present (11/03/2022)   Received from [facility] - Occupational Stress Questionnaire    Feeling of Stress : Only a little  Social Connections: Socially Integrated (11/03/2022)   Received from [facility]   Social Network    How would you rate your social network (family, work, friends)?: Good participation with social networks  Intimate Partner Violence: Not At  Risk (11/03/2022)   Received from [facility]   HITS    Over the last 12 months how often did your partner physically hurt you?: 1    Over the last 12 months how often did your partner insult you or talk down to you?: 1    Over the last 12 months how often did your partner threaten you with physical harm?: 1    Over the last 12 months how often did your partner scream or curse at you?: 1    Family History: Family History  Problem Relation Age of Onset   Hypothyroidism Mother    Hypertension Father    Cancer Father        skin, lymph nodes   Alzheimer's disease Maternal Grandmother    Diabetes Paternal Grandmother    Heart disease Paternal Grandmother        CAD   Asthma Son    Allergies Son    Eczema Son    Colon cancer Neg Hx     Review of Systems: ROS Denies any chest pain, difficulty breathing, leg swelling, fevers.  Physical Exam: Vital Signs BP 116/76 (BP Location: Left Arm, Patient Position: Sitting, Cuff Size: Small)   Pulse 80   SpO2 100%   Physical Exam Constitutional:      General: Not in acute distress.    Appearance: Normal appearance. Not ill-appearing.  HENT:     Head: Normocephalic and atraumatic.  Eyes:     Pupils: Pupils are equal, round. Cardiovascular:     Rate and Rhythm: Normal rate.    Pulses: Normal pulses.  Pulmonary:     Effort: No respiratory distress or increased work of breathing.  Speaks in full  sentences. Abdominal:     General: Abdomen is flat. No distension.   Musculoskeletal: Normal range of motion. No lower extremity swelling or edema. No varicosities. Skin:    General: Skin is warm and dry.     Findings: No erythema or rash.  Neurological:     Mental Status: Alert and oriented to person, place, and time.  Psychiatric:        Mood and Affect: Mood normal.        Behavior: Behavior normal.    Assessment/Plan: The patient is scheduled for bilateral breast reduction with possible liposuction with Dr. Ulice Bold.  Risks, benefits, and alternatives of procedure discussed, questions answered and consent obtained.    Smoking Status: Non-smoker. Last Mammogram: 01/2023; Results: No significant change from previous exam.  No suspicious masses or calcifications.  Caprini Score: 4; Risk Factors include: Age, BMI greater than 25, and length of planned surgery. Recommendation for mechanical prophylaxis. Encourage early ambulation.   Pictures obtained: 02/01/2023  Post-op Rx sent to pharmacy: Clindamycin, oxycodone, and Zofran.  PCN allergy.  She has taken clindamycin in the past with good effect.  Patient was provided with the General Surgical Risk consent document and Pain Medication Agreement prior to their appointment.  They had adequate time to read through the risk consent documents and Pain Medication Agreement. We also discussed them in person together during this preop appointment. All of their questions were answered to their satisfaction.  Recommended calling if they have any further questions.  Risk consent form and Pain Medication Agreement to be scanned into patient's chart.  The risk that can be encountered with breast reduction were discussed and include the following but not limited to these:  Breast asymmetry, fluid accumulation, firmness of the breast, inability to breast feed, loss of  nipple or areola, skin loss, decrease or no nipple sensation, fat necrosis of the  breast tissue, bleeding, infection, healing delay.  There are risks of anesthesia, changes to skin sensation and injury to nerves or blood vessels.  The muscle can be temporarily or permanently injured.  You may have an allergic reaction to tape, suture, glue, blood products which can result in skin discoloration, swelling, pain, skin lesions, poor healing.  Any of these can lead to the need for revisonal surgery or stage procedures.  A reduction has potential to interfere with diagnostic procedures.  Nipple or breast piercing can increase risks of infection.  This procedure is best done when the breast is fully developed.  Changes in the breast will continue to occur over time.  Pregnancy can alter the outcomes of previous breast reduction surgery, weight gain and weigh loss can also effect the long term appearance.     Electronically signed by: Evelena Leyden, PA-C 03/23/2023 3:33 PM

## 2023-04-05 ENCOUNTER — Telehealth: Payer: Self-pay | Admitting: *Deleted

## 2023-04-05 NOTE — Telephone Encounter (Signed)
LVM regarding change to surgery / arrival time.

## 2023-04-07 ENCOUNTER — Encounter (HOSPITAL_BASED_OUTPATIENT_CLINIC_OR_DEPARTMENT_OTHER): Payer: Self-pay | Admitting: Plastic Surgery

## 2023-04-13 ENCOUNTER — Other Ambulatory Visit: Payer: Self-pay

## 2023-04-13 ENCOUNTER — Ambulatory Visit (HOSPITAL_BASED_OUTPATIENT_CLINIC_OR_DEPARTMENT_OTHER): Payer: Commercial Managed Care - PPO | Admitting: Anesthesiology

## 2023-04-13 ENCOUNTER — Encounter (HOSPITAL_BASED_OUTPATIENT_CLINIC_OR_DEPARTMENT_OTHER)
Admission: RE | Disposition: A | Discharge status: Home / Self Care | Payer: Self-pay | Source: Home / Self Care | Attending: Plastic Surgery

## 2023-04-13 ENCOUNTER — Ambulatory Visit (HOSPITAL_BASED_OUTPATIENT_CLINIC_OR_DEPARTMENT_OTHER)
Admission: RE | Admit: 2023-04-13 | Discharge: 2023-04-13 | Disposition: A | Discharge status: Home / Self Care | Payer: Commercial Managed Care - PPO | Attending: Plastic Surgery | Admitting: Plastic Surgery

## 2023-04-13 ENCOUNTER — Encounter (HOSPITAL_BASED_OUTPATIENT_CLINIC_OR_DEPARTMENT_OTHER): Payer: Self-pay | Admitting: Plastic Surgery

## 2023-04-13 DIAGNOSIS — F32A Depression, unspecified: Secondary | ICD-10-CM | POA: Insufficient documentation

## 2023-04-13 DIAGNOSIS — Z01818 Encounter for other preprocedural examination: Secondary | ICD-10-CM

## 2023-04-13 DIAGNOSIS — N62 Hypertrophy of breast: Secondary | ICD-10-CM

## 2023-04-13 DIAGNOSIS — F419 Anxiety disorder, unspecified: Secondary | ICD-10-CM | POA: Insufficient documentation

## 2023-04-13 DIAGNOSIS — Z7985 Long-term (current) use of injectable non-insulin antidiabetic drugs: Secondary | ICD-10-CM | POA: Insufficient documentation

## 2023-04-13 DIAGNOSIS — E039 Hypothyroidism, unspecified: Secondary | ICD-10-CM | POA: Diagnosis not present

## 2023-04-13 HISTORY — PX: BREAST REDUCTION SURGERY: SHX8

## 2023-04-13 HISTORY — DX: Other specified postprocedural states: Z98.890

## 2023-04-13 LAB — POCT PREGNANCY, URINE: Preg Test, Ur: NEGATIVE

## 2023-04-13 SURGERY — BREAST REDUCTION WITH LIPOSUCTION
Anesthesia: General | Site: Chest | Laterality: Bilateral

## 2023-04-13 MED ORDER — FENTANYL CITRATE (PF) 100 MCG/2ML IJ SOLN
25.0000 ug | INTRAMUSCULAR | Status: DC | PRN
  Administered 2023-04-13: 25 ug via INTRAVENOUS

## 2023-04-13 MED ORDER — FENTANYL CITRATE (PF) 100 MCG/2ML IJ SOLN
INTRAMUSCULAR | Status: DC | PRN
  Administered 2023-04-13 (×2): 100 ug via INTRAVENOUS

## 2023-04-13 MED ORDER — PROPOFOL 10 MG/ML IV BOLUS
INTRAVENOUS | Status: DC | PRN
  Administered 2023-04-13: 200 mg via INTRAVENOUS

## 2023-04-13 MED ORDER — PHENYLEPHRINE 80 MCG/ML (10ML) SYRINGE FOR IV PUSH (FOR BLOOD PRESSURE SUPPORT)
PREFILLED_SYRINGE | INTRAVENOUS | Status: AC
  Filled 2023-04-13: qty 10

## 2023-04-13 MED ORDER — FENTANYL CITRATE (PF) 100 MCG/2ML IJ SOLN
INTRAMUSCULAR | Status: AC
  Filled 2023-04-13: qty 2

## 2023-04-13 MED ORDER — SODIUM CHLORIDE (PF) 0.9 % IJ SOLN: INTRAMUSCULAR | Status: DC | PRN

## 2023-04-13 MED ORDER — SUCCINYLCHOLINE CHLORIDE 200 MG/10ML IV SOSY
PREFILLED_SYRINGE | INTRAVENOUS | Status: AC
  Filled 2023-04-13: qty 10

## 2023-04-13 MED ORDER — VASHE WOUND IRRIGATION OPTIME
TOPICAL | Status: DC | PRN
  Administered 2023-04-13: 34 [oz_av]

## 2023-04-13 MED ORDER — OXYCODONE HCL 5 MG PO TABS
ORAL_TABLET | ORAL | Status: AC
  Filled 2023-04-13: qty 1

## 2023-04-13 MED ORDER — ONDANSETRON HCL 4 MG/2ML IJ SOLN
INTRAMUSCULAR | Status: AC
  Filled 2023-04-13: qty 2

## 2023-04-13 MED ORDER — ONDANSETRON HCL 4 MG/2ML IJ SOLN
INTRAMUSCULAR | Status: DC | PRN
  Administered 2023-04-13: 4 mg via INTRAVENOUS

## 2023-04-13 MED ORDER — DEXAMETHASONE SODIUM PHOSPHATE 10 MG/ML IJ SOLN
INTRAMUSCULAR | Status: AC
  Filled 2023-04-13: qty 1

## 2023-04-13 MED ORDER — CIPROFLOXACIN IN D5W 400 MG/200ML IV SOLN
INTRAVENOUS | Status: AC
  Filled 2023-04-13: qty 200

## 2023-04-13 MED ORDER — HEMOSTATIC AGENTS (NO CHARGE) OPTIME
TOPICAL | Status: DC | PRN
  Administered 2023-04-13: 1 via TOPICAL

## 2023-04-13 MED ORDER — LIDOCAINE 2% (20 MG/ML) 5 ML SYRINGE
INTRAMUSCULAR | Status: AC
  Filled 2023-04-13: qty 5

## 2023-04-13 MED ORDER — SODIUM CHLORIDE 0.9 % IV SOLN
INTRAVENOUS | Status: DC | PRN
  Administered 2023-04-13: 40 mL

## 2023-04-13 MED ORDER — PROPOFOL 10 MG/ML IV BOLUS
INTRAVENOUS | Status: AC
  Filled 2023-04-13: qty 20

## 2023-04-13 MED ORDER — LIDOCAINE 2% (20 MG/ML) 5 ML SYRINGE
INTRAMUSCULAR | Status: DC | PRN
  Administered 2023-04-13: 40 mg via INTRAVENOUS

## 2023-04-13 MED ORDER — MIDAZOLAM HCL 2 MG/2ML IJ SOLN
INTRAMUSCULAR | Status: AC
  Filled 2023-04-13: qty 2

## 2023-04-13 MED ORDER — BUPIVACAINE LIPOSOME 1.3 % IJ SUSP
INTRAMUSCULAR | Status: AC
  Filled 2023-04-13: qty 20

## 2023-04-13 MED ORDER — LIDOCAINE-EPINEPHRINE 1 %-1:100000 IJ SOLN
INTRAMUSCULAR | Status: DC | PRN
  Administered 2023-04-13: 50 mL

## 2023-04-13 MED ORDER — SODIUM CHLORIDE 0.9% FLUSH: 3.0000 mL | INTRAVENOUS | Status: DC | PRN

## 2023-04-13 MED ORDER — FENTANYL CITRATE (PF) 100 MCG/2ML IJ SOLN: INTRAMUSCULAR | Status: DC | PRN

## 2023-04-13 MED ORDER — AMISULPRIDE (ANTIEMETIC) 5 MG/2ML IV SOLN
INTRAVENOUS | Status: AC
  Filled 2023-04-13: qty 4

## 2023-04-13 MED ORDER — ACETAMINOPHEN 325 MG PO TABS: 650.0000 mg | ORAL_TABLET | ORAL | Status: DC | PRN

## 2023-04-13 MED ORDER — EPHEDRINE 5 MG/ML INJ
INTRAVENOUS | Status: AC
  Filled 2023-04-13: qty 5

## 2023-04-13 MED ORDER — AMISULPRIDE (ANTIEMETIC) 5 MG/2ML IV SOLN
10.0000 mg | Freq: Once | INTRAVENOUS | Status: AC
  Administered 2023-04-13: 10 mg via INTRAVENOUS

## 2023-04-13 MED ORDER — MIDAZOLAM HCL 5 MG/5ML IJ SOLN
INTRAMUSCULAR | Status: DC | PRN
  Administered 2023-04-13: 2 mg via INTRAVENOUS

## 2023-04-13 MED ORDER — CHLORHEXIDINE GLUCONATE CLOTH 2 % EX PADS
6.0000 | MEDICATED_PAD | Freq: Once | CUTANEOUS | Status: DC
Start: 2023-04-13 — End: 2023-04-13

## 2023-04-13 MED ORDER — ACETAMINOPHEN 325 MG RE SUPP
650.0000 mg | RECTAL | Status: DC | PRN
Start: 2023-04-13 — End: 2023-04-13

## 2023-04-13 MED ORDER — LACTATED RINGERS IV SOLN: INTRAVENOUS | Status: DC

## 2023-04-13 MED ORDER — SODIUM CHLORIDE 0.9% FLUSH: 3.0000 mL | Freq: Two times a day (BID) | INTRAVENOUS | Status: DC

## 2023-04-13 MED ORDER — CIPROFLOXACIN IN D5W 400 MG/200ML IV SOLN
400.0000 mg | INTRAVENOUS | Status: AC
  Administered 2023-04-13: 400 mg via INTRAVENOUS

## 2023-04-13 MED ORDER — LIDOCAINE HCL (CARDIAC) PF 100 MG/5ML IV SOSY
PREFILLED_SYRINGE | INTRAVENOUS | Status: DC | PRN
  Administered 2023-04-13: 40 mg via INTRAVENOUS

## 2023-04-13 MED ORDER — SODIUM CHLORIDE 0.9 % IV SOLN: 250.0000 mL | INTRAVENOUS | Status: DC | PRN

## 2023-04-13 MED ORDER — OXYCODONE HCL 5 MG PO TABS
5.0000 mg | ORAL_TABLET | ORAL | Status: DC | PRN
  Administered 2023-04-13: 5 mg via ORAL

## 2023-04-13 MED ORDER — DEXAMETHASONE SODIUM PHOSPHATE 4 MG/ML IJ SOLN
INTRAMUSCULAR | Status: DC | PRN
  Administered 2023-04-13: 5 mg via INTRAVENOUS

## 2023-04-13 SURGICAL SUPPLY — 70 items
ADH SKN CLS APL DERMABOND .7 (GAUZE/BANDAGES/DRESSINGS) ×4
AGENT HMST PWDR BTL CLGN 5GM (Miscellaneous) ×1 IMPLANT
BAG DECANTER FOR FLEXI CONT (MISCELLANEOUS) ×1 IMPLANT
BINDER BREAST LRG (GAUZE/BANDAGES/DRESSINGS) IMPLANT
BINDER BREAST MEDIUM (GAUZE/BANDAGES/DRESSINGS) IMPLANT
BINDER BREAST XLRG (GAUZE/BANDAGES/DRESSINGS) IMPLANT
BINDER BREAST XXLRG (GAUZE/BANDAGES/DRESSINGS) IMPLANT
BIOPATCH RED 1 DISK 7.0 (GAUZE/BANDAGES/DRESSINGS) IMPLANT
BLADE HEX COATED 2.75 (ELECTRODE) IMPLANT
BLADE KNIFE PERSONA 10 (BLADE) ×2 IMPLANT
BLADE SURG 15 STRL LF DISP TIS (BLADE) ×1 IMPLANT
BLADE SURG 15 STRL SS (BLADE) ×1
CANISTER SUCT 1200ML W/VALVE (MISCELLANEOUS) ×1 IMPLANT
COLLAGEN CELLERATERX 5 GRAM (Miscellaneous) IMPLANT
COVER BACK TABLE 60X90IN (DRAPES) ×1 IMPLANT
COVER MAYO STAND STRL (DRAPES) ×1 IMPLANT
DERMABOND ADVANCED .7 DNX12 (GAUZE/BANDAGES/DRESSINGS) ×2 IMPLANT
DRAIN CHANNEL 19F RND (DRAIN) IMPLANT
DRAPE LAPAROSCOPIC ABDOMINAL (DRAPES) ×1 IMPLANT
DRAPE UTILITY XL STRL (DRAPES) ×1 IMPLANT
DRSG MEPILEX POST OP 4X8 (GAUZE/BANDAGES/DRESSINGS) ×2 IMPLANT
ELECT BLADE 4.0 EZ CLEAN MEGAD (MISCELLANEOUS) ×1
ELECT REM PT RETURN 9FT ADLT (ELECTROSURGICAL) ×1
ELECTRODE BLDE 4.0 EZ CLN MEGD (MISCELLANEOUS) ×1 IMPLANT
ELECTRODE REM PT RTRN 9FT ADLT (ELECTROSURGICAL) ×1 IMPLANT
EVACUATOR SILICONE 100CC (DRAIN) IMPLANT
GAUZE PAD ABD 8X10 STRL (GAUZE/BANDAGES/DRESSINGS) ×2 IMPLANT
GLOVE BIO SURGEON STRL SZ 6.5 (GLOVE) ×3 IMPLANT
GLOVE BIO SURGEON STRL SZ7.5 (GLOVE) ×1 IMPLANT
GLOVE BIOGEL PI IND STRL 7.0 (GLOVE) IMPLANT
GLOVE BIOGEL PI IND STRL 8 (GLOVE) IMPLANT
GOWN STRL REUS W/ TWL LRG LVL3 (GOWN DISPOSABLE) ×2 IMPLANT
GOWN STRL REUS W/ TWL XL LVL3 (GOWN DISPOSABLE) ×1 IMPLANT
GOWN STRL REUS W/TWL LRG LVL3 (GOWN DISPOSABLE) ×3
GOWN STRL REUS W/TWL XL LVL3 (GOWN DISPOSABLE) ×1
HEMOSTAT ARISTA ABSORB 3G PWDR (HEMOSTASIS) IMPLANT
NDL FILTER BLUNT 18X1 1/2 (NEEDLE) IMPLANT
NDL HYPO 25X1 1.5 SAFETY (NEEDLE) ×2 IMPLANT
NEEDLE FILTER BLUNT 18X1 1/2 (NEEDLE) IMPLANT
NEEDLE HYPO 25X1 1.5 SAFETY (NEEDLE) ×2 IMPLANT
NS IRRIG 1000ML POUR BTL (IV SOLUTION) ×1 IMPLANT
PACK BASIN DAY SURGERY FS (CUSTOM PROCEDURE TRAY) ×1 IMPLANT
PAD ALCOHOL SWAB (MISCELLANEOUS) IMPLANT
PAD FOAM SILICONE BACKED (GAUZE/BANDAGES/DRESSINGS) IMPLANT
PENCIL SMOKE EVACUATOR (MISCELLANEOUS) ×1 IMPLANT
PIN SAFETY STERILE (MISCELLANEOUS) IMPLANT
SLEEVE SCD COMPRESS KNEE MED (STOCKING) ×1 IMPLANT
SPIKE FLUID TRANSFER (MISCELLANEOUS) IMPLANT
SPONGE T-LAP 18X18 ~~LOC~~+RFID (SPONGE) ×2 IMPLANT
STRIP SUTURE WOUND CLOSURE 1/2 (MISCELLANEOUS) ×2 IMPLANT
SUT MNCRL AB 4-0 PS2 18 (SUTURE) ×4 IMPLANT
SUT MON AB 3-0 SH 27 (SUTURE) ×8
SUT MON AB 3-0 SH27 (SUTURE) ×4 IMPLANT
SUT MON AB 5-0 PS2 18 (SUTURE) IMPLANT
SUT PDS 3-0 CT2 (SUTURE) ×4
SUT PDS II 3-0 CT2 27 ABS (SUTURE) ×4 IMPLANT
SUT SILK 3 0 PS 1 (SUTURE) IMPLANT
SUT VIC AB 3-0 SH 27 (SUTURE) ×1
SUT VIC AB 3-0 SH 27X BRD (SUTURE) IMPLANT
SYR 50ML LL SCALE MARK (SYRINGE) IMPLANT
SYR BULB IRRIG 60ML STRL (SYRINGE) ×1 IMPLANT
SYR CONTROL 10ML LL (SYRINGE) ×2 IMPLANT
TAPE MEASURE VINYL STERILE (MISCELLANEOUS) IMPLANT
TOWEL GREEN STERILE FF (TOWEL DISPOSABLE) ×3 IMPLANT
TRAY DSU PREP LF (CUSTOM PROCEDURE TRAY) ×1 IMPLANT
TUBE CONNECTING 20X1/4 (TUBING) ×1 IMPLANT
TUBING INFILTRATION IT-10001 (TUBING) IMPLANT
TUBING SET GRADUATE ASPIR 12FT (MISCELLANEOUS) IMPLANT
UNDERPAD 30X36 HEAVY ABSORB (UNDERPADS AND DIAPERS) ×2 IMPLANT
YANKAUER SUCT BULB TIP NO VENT (SUCTIONS) ×1 IMPLANT

## 2023-04-13 NOTE — Anesthesia Procedure Notes (Signed)
Procedure Name: LMA Insertion Date/Time: 04/13/2023 1:22 PM  Performed by: Ronnette Hila, CRNAPre-anesthesia Checklist: Patient identified, Emergency Drugs available, Suction available and Patient being monitored Patient Re-evaluated:Patient Re-evaluated prior to induction Oxygen Delivery Method: Circle System Utilized Preoxygenation: Pre-oxygenation with 100% oxygen Induction Type: IV induction Ventilation: Mask ventilation without difficulty LMA: LMA inserted LMA Size: 4.0 Number of attempts: 1 Airway Equipment and Method: bite block Placement Confirmation: positive ETCO2 Tube secured with: Tape Dental Injury: Teeth and Oropharynx as per pre-operative assessment

## 2023-04-13 NOTE — Transfer of Care (Signed)
Immediate Anesthesia Transfer of Care Note  Patient: [name]  Procedure(s) Performed: BREAST REDUCTION WITH LIPOSUCTION (Bilateral: Chest)  Patient Location: PACU  Anesthesia Type:General  Level of Consciousness: sedated  Airway & Oxygen Therapy: Patient Spontanous Breathing and Patient connected to nasal cannula oxygen  Post-op Assessment: Report given to RN and Post -op Vital signs reviewed and stable  Post vital signs: Reviewed and stable  Last Vitals:  Vitals Value Taken Time  BP    Temp    Pulse    Resp    SpO2      Last Pain:  Vitals:   04/13/23 1136  TempSrc: Temporal  PainSc: 0-No pain         Complications: No notable events documented.

## 2023-04-13 NOTE — Op Note (Signed)
Breast Reduction Op note:    DATE OF PROCEDURE: 04/13/2023  LOCATION: [facility]  SURGEON: Foster Simpson, DO  ASSISTANT: Evelena Leyden, PA  PREOPERATIVE DIAGNOSIS 1. Macromastia 2. Neck Pain 3. Back Pain  POSTOPERATIVE DIAGNOSIS 1. Macromastia 2. Neck Pain 3. Back Pain  PROCEDURES 1. Bilateral breast reduction.  Right reduction 613 g, Left reduction 603 g  COMPLICATIONS: None.  DRAINS: none  INDICATIONS FOR PROCEDURE [name] is a 52 y.o. year-old female born on Nov 16, 1970,with a history of symptomatic macromastia with concominant back pain, neck pain, shoulder grooving from her bra.   MRN: [id number]  CONSENT Informed consent was obtained directly from the patient. The risks, benefits and alternatives were fully discussed. Specific risks including but not limited to bleeding, infection, hematoma, seroma, scarring, pain, nipple necrosis, asymmetry, poor cosmetic results, and need for further surgery were discussed. The patient's questions were answered.  DESCRIPTION OF PROCEDURE  Patient was brought into the operating room and rested on the operating room table in the supine position.  SCDs were placed and appropriate padding was performed.  Antibiotics were given. The patient underwent general anesthesia and the chest was prepped and draped in a sterile fashion.  A timeout was performed and all information was confirmed to be correct by those in the room.  Right side: Preoperative markings were confirmed.  Incision lines were injected with local containing epinephrine.  After waiting for vasoconstriction, the marked lines were incised with a #15 blade.  A Wise-pattern superomedial breast reduction was performed by de-epithelializing the pedicle, using bovie to create the superomedial pedicle, and removing breast tissue from the superior, lateral, and inferior portions of the breast.  Care was taken to not undermine the breast pedicle. Hemostasis  was achieved.  Cellerate and Experel was placed in the pocket. The nipple was gently rotated into position and the soft tissue closed with 4-0 Monocryl.   The pocket was irrigated and hemostasis confirmed.  The deep tissues were approximated with 3-0 PDS sutures.  The skin was closed with deep dermal 3-0 Monocryl and subcuticular 4-0 Monocryl sutures.  The nipple and skin flaps had good capillary refill at the end of the procedure.    Left side: Preoperative markings were confirmed.  Incision lines were injected with local containing epinephrine.  After waiting for vasoconstriction, the marked lines were incised with a #15 blade.  A Wise-pattern superomedial breast reduction was performed by de-epithelializing the pedicle, using bovie to create the superomedial pedicle, and removing breast tissue from the superior, lateral, and inferior portions of the breast.  Care was taken to not undermine the breast pedicle. Hemostasis was achieved.  Arista was used in each breast pocket laterally. The nipple was gently rotated into position and the soft tissue was closed with 4-0 Monocryl.  The patient was sat upright and size and shape symmetry was confirmed.  The pocket was irrigated and hemostasis confirmed. Cellerate and Experel was placed in the pocket. The deep tissues were approximated with 3-0 PDS sutures. The skin was closed with deep dermal 3-0 Monocryl and subcuticular 4-0 Monocryl sutures.  Dermabond was applied.  A breast binder and ABDs were placed.  The nipple and skin flaps had good capillary refill at the end of the procedure.  The patient tolerated the procedure well. The patient was allowed to wake from anesthesia and taken to the recovery room in satisfactory condition.  The advanced practice practitioner (APP) assisted throughout the case.  The APP was essential in retraction  and counter traction when needed to make the case progress smoothly.  This retraction and assistance made it possible to see the  tissue plans for the procedure.  The assistance was needed for blood control, tissue re-approximation and assisted with closure of the incision site.

## 2023-04-13 NOTE — Interval H&P Note (Signed)
History and Physical Interval Note:  04/13/2023 12:39 PM  [name]  has presented today for surgery, with the diagnosis of Macromastia.  The various methods of treatment have been discussed with the patient and family. After consideration of risks, benefits and other options for treatment, the patient has consented to  Procedure(s): BREAST REDUCTION WITH LIPOSUCTION (Bilateral) as a surgical intervention.  The patient's history has been reviewed, patient examined, no change in status, stable for surgery.  I have reviewed the patient's chart and labs.  Questions were answered to the patient's satisfaction.     [name]

## 2023-04-13 NOTE — Discharge Instructions (Addendum)
Post Anesthesia Home Care Instructions  Activity: Get plenty of rest for the remainder of the day. A responsible individual must stay with you for 24 hours following the procedure.  For the next 24 hours, DO NOT: -Drive a car -Advertising copywriter -Drink alcoholic beverages -Take any medication unless instructed by your physician -Make any legal decisions or sign important papers.  Meals: Start with liquid foods such as gelatin or soup. Progress to regular foods as tolerated. Avoid greasy, spicy, heavy foods. If nausea and/or vomiting occur, drink only clear liquids until the nausea and/or vomiting subsides. Call your physician if vomiting continues.  Special Instructions/Symptoms: Your throat may feel dry or sore from the anesthesia or the breathing tube placed in your throat during surgery. If this causes discomfort, gargle with warm salt water. The discomfort should disappear within 24 hours.  If you had a scopolamine patch placed behind your ear for the management of post- operative nausea and/or vomiting:  1. The medication in the patch is effective for 72 hours, after which it should be removed.  Wrap patch in a tissue and discard in the trash. Wash hands thoroughly with soap and water. 2. You may remove the patch earlier than 72 hours if you experience unpleasant side effects which may include dry mouth, dizziness or visual disturbances. 3. Avoid touching the patch. Wash your hands with soap and water after contact with the patch.     INSTRUCTIONS FOR AFTER BREAST SURGERY   You will likely have some questions about what to expect following your operation.  The following information will help you and your family understand what to expect when you are discharged from the hospital.  It is important to follow these guidelines to help ensure a smooth recovery and reduce complication.  Postoperative instructions include information on: diet, wound care, medications and physical  activity.  AFTER SURGERY Expect to go home after the procedure.  In some cases, you may need to spend one night in the hospital for observation.  DIET Breast surgery does not require a specific diet.  However, the healthier you eat the better your body will heal. It is important to increasing your protein intake.  This means limiting the foods with sugar and carbohydrates.  Focus on vegetables and some meat.  If you have liposuction during your procedure be sure to drink water.  If your urine is bright yellow, then it is concentrated, and you need to drink more water.  As a general rule after surgery, you should have 8 ounces of water every hour while awake.  If you find you are persistently nauseated or unable to take in liquids let us know.  NO TOBACCO USE or EXPOSURE.  This will slow your healing process and lead to a wound.  WOUND CARE Leave the binder on for 3 days . Use fragrance free soap like Dial, Dove or [place].   After 3 days you can remove the binder to shower. Once dry apply binder or sports bra. If you have liposuction you will have a soft and spongy dressing (Lipofoam) that helps prevent creases in your skin.  Remove before you shower and then replace it.  It is also available on [employer]. If you have steri-strips / tape directly attached to your skin leave them in place. It is OK to get these wet.   No baths, pools or hot tubs for four weeks. We close your incision to leave the smallest and best-looking scar. No ointment or creams on your  incisions for four weeks.  No Neosporin (Too many skin reactions).  A few weeks after surgery you can use Mederma and start massaging the scar. We ask you to wear your binder or sports bra for the first 6 weeks around the clock, including while sleeping. This provides added comfort and helps reduce the fluid accumulation at the surgery site. NO Ice or heating pads to the operative site.  You have a very high risk of a BURN before you feel the  temperature change.  ACTIVITY No heavy lifting until cleared by the doctor.  This usually means no more than a half-gallon of milk.  It is OK to walk and climb stairs. Moving your legs is very important to decrease your risk of a blood clot.  It will also help keep you from getting deconditioned.  Every 1 to 2 hours get up and walk for 5 minutes. This will help with a quicker recovery back to normal.  Let pain be your guide so you don't do too much.  This time is for you to recover.  You will be more comfortable if you sleep and rest with your head elevated either with a few pillows under you or in a recliner.  No stomach sleeping for a three months.  WORK Everyone returns to work at different times. As a rough guide, most people take at least 1 - 2 weeks off prior to returning to work. If you need documentation for your job, give the forms to the front staff at the clinic.  DRIVING Arrange for someone to bring you home from the hospital after your surgery.  You may be able to drive a few days after surgery but not while taking any narcotics or valium.  BOWEL MOVEMENTS Constipation can occur after anesthesia and while taking pain medication.  It is important to stay ahead for your comfort.  We recommend taking Milk of Magnesia (2 tablespoons; twice a day) while taking the pain pills.  MEDICATIONS You may be prescribed should start after surgery At your preoperative visit for you history and physical you may have been given the following medications: An antibiotic: Start this medication when you get home and take according to the instructions on the bottle. Zofran 4 mg:  This is to treat nausea and vomiting.  You can take this every 6 hours as needed and only if needed. Valium 2 mg for breast cancer patients: This is for muscle tightness if you have an implant or expander. This will help relax your muscle which also helps with pain control.  This can be taken every 12 hours as needed. Don't drive  after taking this medication. Norco (hydrocodone/acetaminophen) 5/325 mg:  This is only to be used after you have taken the Motrin or the Tylenol. Every 8 hours as needed.   Over the counter Medication to take: Ibuprofen (Motrin) 600 mg:  Take this every 6 hours.  If you have additional pain then take 500 mg of the Tylenol every 8 hours.  Only take the Norco after you have tried these two. MiraLAX or Milk of Magnesia: Take this according to the bottle if you take the Norco.  WHEN TO CALL Call your surgeon's office if any of the following occur: Fever 101 degrees F or greater Excessive bleeding or fluid from the incision site. Pain that increases over time without aid from the medications Redness, warmth, or pus draining from incision sites Persistent nausea or inability to take in liquids Severe misshapen area that  underwent the operation.   Information for Discharge Teaching: EXPAREL (bupivacaine liposome injectable suspension)   Pain relief is important to your recovery. The goal is to control your pain so you can move easier and return to your normal activities as soon as possible after your procedure. Your physician may use several types of medicines to manage pain, swelling, and more.  Your surgeon or anesthesiologist gave you EXPAREL(bupivacaine) to help control your pain after surgery.  EXPAREL is a local anesthetic designed to release slowly over an extended period of time to provide pain relief by numbing the tissue around the surgical site. EXPAREL is designed to release pain medication over time and can control pain for up to 72 hours. Depending on how you respond to EXPAREL, you may require less pain medication during your recovery. EXPAREL can help reduce or eliminate the need for opioids during the first few days after surgery when pain relief is needed the most. EXPAREL is not an opioid and is not addictive. It does not cause sleepiness or sedation.   Important! A teal  colored band has been placed on your arm with the date, time and amount of EXPAREL you have received. Please leave this armband in place for the full 96 hours following administration, and then you may remove the band. If you return to the hospital for any reason within 96 hours following the administration of EXPAREL, the armband provides important information that your health care providers to know, and alerts them that you have received this anesthetic.    Possible side effects of EXPAREL: Temporary loss of sensation or ability to move in the area where medication was injected. Nausea, vomiting, constipation Rarely, numbness and tingling in your mouth or lips, lightheadedness, or anxiety may occur. Call your doctor right away if you think you may be experiencing any of these sensations, or if you have other questions regarding possible side effects.  Follow all other discharge instructions given to you by your surgeon or nurse. Eat a healthy diet and drink plenty of water or other fluids.

## 2023-04-13 NOTE — Anesthesia Preprocedure Evaluation (Addendum)
Anesthesia Evaluation  Patient identified by MRN, date of birth, ID band Patient awake    Reviewed: Allergy & Precautions, NPO status , Patient's Chart, lab work & pertinent test results  History of Anesthesia Complications (+) PONV and history of anesthetic complications  Airway Mallampati: I  TM Distance: >3 FB Neck ROM: Full    Dental  (+) Teeth Intact, Dental Advisory Given   Pulmonary neg pulmonary ROS   breath sounds clear to auscultation       Cardiovascular negative cardio ROS  Rhythm:Regular Rate:Normal     Neuro/Psych  PSYCHIATRIC DISORDERS Anxiety Depression    negative neurological ROS     GI/Hepatic negative GI ROS, Neg liver ROS,,,  Endo/Other  Hypothyroidism    Renal/GU negative Renal ROS     Musculoskeletal negative musculoskeletal ROS (+)    Abdominal   Peds  Hematology negative hematology ROS (+)   Anesthesia Other Findings   Reproductive/Obstetrics                             Anesthesia Physical Anesthesia Plan  ASA: 2  Anesthesia Plan: General   Post-op Pain Management: Tylenol PO (pre-op)* and Toradol IV (intra-op)*   Induction: Intravenous  PONV Risk Score and Plan: 4 or greater and Ondansetron, Dexamethasone, Midazolam and Scopolamine patch - Pre-op  Airway Management Planned: Oral ETT and LMA  Additional Equipment: None  Intra-op Plan:   Post-operative Plan: Extubation in OR  Informed Consent: I have reviewed the patients History and Physical, chart, labs and discussed the procedure including the risks, benefits and alternatives for the proposed anesthesia with the patient or authorized representative who has indicated his/her understanding and acceptance.     Dental advisory given  Plan Discussed with: CRNA  Anesthesia Plan Comments:        Anesthesia Quick Evaluation

## 2023-04-14 ENCOUNTER — Encounter (HOSPITAL_BASED_OUTPATIENT_CLINIC_OR_DEPARTMENT_OTHER): Payer: Self-pay | Admitting: Plastic Surgery

## 2023-04-14 NOTE — Progress Notes (Signed)
Patient is a pleasant 52 year old female s/p bilateral breast reduction performed 04/13/2023 Dr. Ulice Bold who joins via telephone for postoperative day 2 check-in.  Reviewed operative report and approximately 600 g was removed from each breast at time of surgery.  Today, patient is doing well.  She states that she has some burning sensation at the right inframammary fold, worse with certain movements.  However, overall her pain is very manageable.  She denies any asymmetric swelling or constant pain symptoms.  She denies any obvious drainage from underneath the bandages.  Her husband and kids have been assisting with her postoperative recovery at home.  She is tolerating p.o. intake well.  Ambulatory, voiding.  Denies any chest pain, leg swelling, fevers.  Discussed continued activity modifications and compressive garments.  Plan to follow-up next week, as scheduled.  She can certainly call the clinic should she have any new or worsening symptoms in the interim.

## 2023-04-14 NOTE — Anesthesia Postprocedure Evaluation (Signed)
Anesthesia Post Note  Patient: [name]  Procedure(s) Performed: BREAST REDUCTION WITH LIPOSUCTION (Bilateral: Chest)     Patient location during evaluation: PACU Anesthesia Type: General Level of consciousness: awake and alert Pain management: pain level controlled Vital Signs Assessment: post-procedure vital signs reviewed and stable Respiratory status: spontaneous breathing, nonlabored ventilation, respiratory function stable and patient connected to nasal cannula oxygen Cardiovascular status: blood pressure returned to baseline and stable Postop Assessment: no apparent nausea or vomiting Anesthetic complications: no   No notable events documented.  Last Vitals:  Vitals:   04/13/23 1639 04/13/23 1652  BP: 114/66 121/75  Pulse: 81 84  Resp: 15 16  Temp:  (!) 36.3 C  SpO2: 96% 96%    Last Pain:  Vitals:   04/13/23 1652  TempSrc:   PainSc: 5                  Shelton Silvas

## 2023-04-15 ENCOUNTER — Ambulatory Visit (INDEPENDENT_AMBULATORY_CARE_PROVIDER_SITE_OTHER): Payer: Commercial Managed Care - PPO | Admitting: Physician Assistant

## 2023-04-15 DIAGNOSIS — Z9889 Other specified postprocedural states: Secondary | ICD-10-CM

## 2023-04-15 LAB — SURGICAL PATHOLOGY

## 2023-04-19 ENCOUNTER — Encounter: Payer: Self-pay | Admitting: Physician Assistant

## 2023-04-19 ENCOUNTER — Telehealth: Payer: Self-pay | Admitting: Physician Assistant

## 2023-04-19 NOTE — Telephone Encounter (Signed)
Spoke with patient and provided a letter.

## 2023-04-19 NOTE — Telephone Encounter (Signed)
Pt called and says FMLA Paperwork was filled out incorrectly. She says that her and Gerre Pebbles had a conversation about her working from home during her post op period. She says that her job informed her that Gerre Pebbles checked a box that says "continuous leave" on the paperwork. This patient would like a call back to discuss changing the mistake.

## 2023-04-22 ENCOUNTER — Encounter: Payer: Self-pay | Admitting: Plastic Surgery

## 2023-04-22 ENCOUNTER — Telehealth: Payer: Self-pay | Admitting: Plastic Surgery

## 2023-04-22 ENCOUNTER — Ambulatory Visit (INDEPENDENT_AMBULATORY_CARE_PROVIDER_SITE_OTHER): Payer: Commercial Managed Care - PPO | Admitting: Plastic Surgery

## 2023-04-22 VITALS — BP 114/74 | HR 76

## 2023-04-22 DIAGNOSIS — N62 Hypertrophy of breast: Secondary | ICD-10-CM

## 2023-04-22 NOTE — Progress Notes (Signed)
The patient is a 52 year old female here for follow-up after undergoing bilateral breast reduction on November 6.  Overall she is doing really well.  She had over 600 g removed from both breasts.  There is no sign of a hematoma or infection.  She has quite a bit of bruising and swelling as expected.  Her pain level is well-controlled.  Recommend continuing with sports bra and follow-up in 2 weeks.

## 2023-04-22 NOTE — Telephone Encounter (Signed)
provider requested, called pt and pt agreed

## 2023-05-03 ENCOUNTER — Ambulatory Visit: Payer: Commercial Managed Care - PPO | Admitting: Physician Assistant

## 2023-05-03 ENCOUNTER — Encounter: Payer: Self-pay | Admitting: Physician Assistant

## 2023-05-03 VITALS — BP 136/77 | HR 86 | Ht 64.0 in | Wt 184.0 lb

## 2023-05-03 DIAGNOSIS — Z9889 Other specified postprocedural states: Secondary | ICD-10-CM

## 2023-05-03 NOTE — Progress Notes (Signed)
Patient is a pleasant 52 year old female s/p bilateral breast reduction performed 04/13/2023 Dr. Ulice Bold who presents to clinic for postoperative follow-up.  She was last seen here in clinic on 04/22/2023.  At that time, she was doing well and exam was entirely reassuring.  No evidence concerning for infection or hematoma.  Bruising and swelling as expected.  Pain controlled.  Follow-up in 2 weeks, as scheduled.  Today, patient is doing well.  No complaints.  She is quite pleased with the new size of her breasts.  She states the bandages are off but the Steri-Strips have remained in place.  She denies any fevers, chest pain, difficulty breathing, leg swelling, or other concerns.  On exam, breasts have excellent shape and symmetry.  Steri-Strips removed without complication or difficulty.  Small, less than 1 cm incisional wound at the inferior T zone right breast.  Nondraining.  Surrounding skin and tissue appears healthy.  No other incisional wounds appreciated.  Residual ecchymoses over medial aspects of each breast bilaterally, but no underlying firmness concerning for hematoma.  Soft throughout.  Recommend continued activity modifications and compressive garments.  Follow-up in 2 weeks, as scheduled.  She can begin to slowly increase activity, but still avoid any heavy lifting.  She will call the office should she have any questions or concerns.  Will obtain final postoperative photos at next encounter.

## 2023-05-17 ENCOUNTER — Encounter: Payer: Self-pay | Admitting: Physician Assistant

## 2023-05-17 ENCOUNTER — Ambulatory Visit (INDEPENDENT_AMBULATORY_CARE_PROVIDER_SITE_OTHER): Payer: Commercial Managed Care - PPO | Admitting: Physician Assistant

## 2023-05-17 VITALS — BP 125/60 | HR 80

## 2023-05-17 DIAGNOSIS — Z9889 Other specified postprocedural states: Secondary | ICD-10-CM

## 2023-05-17 NOTE — Progress Notes (Signed)
Patient is a pleasant 52 year old female s/p bilateral breast reduction performed 04/13/2023 Dr. Ulice Bold who presents to clinic for postoperative follow-up.   She was last seen here in clinic on 05/03/2023.  At that time, small, less than 1 cm incisional wound at inferior T zone right breast.  Remainder of exam is entirely benign.  Obtain final postoperative photos at subsequent encounter  Today, patient is doing well.  She states that she had a couple small incisional wounds on the right areola, but they have already scabbed over.  She has been applying Vaseline everywhere.  Inquires about ongoing restrictions.  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  On exam, breasts have excellent shape and symmetry.  Right NAC with 2 small incisional wounds with overlying scabbing where sutures had been placed.  She is otherwise well-healed without, no wounds or areas of concern.  There is some mild firmness palpated on inferior aspect of right breast, but no fluctuance or crepitus.  Breasts are otherwise soft throughout.  Recommending mechanical massage of the right breast on the area of firmness, likely reflective of fat necrosis, scar tissue formation, or old hematoma.  Suspect that it will improve with time and continued massage.  Recommending silicone scar gels twice daily to incisions throughout.  No ongoing restrictions at this time and she can increase activity as tolerated.  Follow-up as needed.  Picture(s) obtained of the patient and placed in the chart were with the patient's or guardian's permission.

## 2024-04-12 ENCOUNTER — Other Ambulatory Visit (HOSPITAL_COMMUNITY): Payer: Self-pay | Admitting: Adult Health Nurse Practitioner

## 2024-04-12 DIAGNOSIS — Z1231 Encounter for screening mammogram for malignant neoplasm of breast: Secondary | ICD-10-CM

## 2024-04-20 ENCOUNTER — Other Ambulatory Visit: Payer: Self-pay | Admitting: Medical Genetics

## 2024-04-27 ENCOUNTER — Encounter (HOSPITAL_COMMUNITY): Payer: Self-pay

## 2024-04-27 ENCOUNTER — Ambulatory Visit (HOSPITAL_COMMUNITY)
Admission: RE | Admit: 2024-04-27 | Discharge: 2024-04-27 | Disposition: A | Discharge status: Home / Self Care | Payer: Self-pay | Source: Ambulatory Visit | Attending: Adult Health Nurse Practitioner | Admitting: Adult Health Nurse Practitioner

## 2024-04-27 DIAGNOSIS — Z1231 Encounter for screening mammogram for malignant neoplasm of breast: Secondary | ICD-10-CM | POA: Insufficient documentation

## 2024-05-09 LAB — GENECONNECT MOLECULAR SCREEN: Genetic Analysis Overall Interpretation: NEGATIVE
